# Patient Record
Sex: Female | Born: 1985 | Race: Black or African American | Hispanic: No | Marital: Married | Smoking: Current every day smoker
Health system: Southern US, Community
[De-identification: ages and names within clinical notes are randomized; demographics above are authoritative.]

## PROBLEM LIST (undated history)

## (undated) DIAGNOSIS — O24419 Gestational diabetes mellitus in pregnancy, unspecified control: Secondary | ICD-10-CM

## (undated) DIAGNOSIS — J45909 Unspecified asthma, uncomplicated: Secondary | ICD-10-CM

## (undated) HISTORY — PX: TONSILLECTOMY AND ADENOIDECTOMY: SUR1326

---

## 2012-05-16 ENCOUNTER — Emergency Department (HOSPITAL_BASED_OUTPATIENT_CLINIC_OR_DEPARTMENT_OTHER)
Admission: EM | Admit: 2012-05-16 | Discharge: 2012-05-16 | Disposition: A | Discharge status: Home / Self Care | Payer: Self-pay | Attending: Emergency Medicine | Admitting: Emergency Medicine

## 2012-05-16 ENCOUNTER — Encounter (HOSPITAL_BASED_OUTPATIENT_CLINIC_OR_DEPARTMENT_OTHER): Payer: Self-pay | Admitting: *Deleted

## 2012-05-16 DIAGNOSIS — K143 Hypertrophy of tongue papillae: Secondary | ICD-10-CM | POA: Insufficient documentation

## 2012-05-16 MED ORDER — MAGIC MOUTHWASH: 10.0000 mL | Freq: Four times a day (QID) | ORAL | Status: DC | PRN

## 2012-05-16 NOTE — ED Notes (Signed)
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 Pt now c/o blisters in her mouth, denies fever

## 2012-05-16 NOTE — ED Notes (Signed)
MD at bedside. 

## 2012-05-16 NOTE — ED Provider Notes (Signed)
History  This chart was scribed for Ethelda Chick, MD by Shari Heritage, ED Scribe. The patient was seen in room MH05/MH05. Patient's care was started at 2208.   CSN: [id number]  Arrival date & time 05/16/12  2101   First MD Initiated Contact with Patient 05/16/12 2208      Chief Complaint  Patient presents with  . Mouth Lesions     Patient is a 27 y.o. female presenting with mouth sores. The history is provided by the patient. No language interpreter was used.  Mouth Lesions  The current episode started today. The problem occurs rarely. The problem has been unchanged. The problem is mild. Associated symptoms include mouth sores. Pertinent negatives include no fever, no vomiting and no rash.     HPI Comments: Hannah Morris is a 27 y.o. female who presents to the Emergency Department complaining of painful blisters on her tongue onset 2 hours ago. Patient denies fever, shortness of breath, vomiting. 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 She denies any rashes to the hands or feet. Patient has no pertinent past medical history. She does not smoke.   History reviewed. No pertinent past medical history.  History reviewed. No pertinent past surgical history.  History reviewed. No pertinent family history.  History  Substance Use Topics  . Smoking status: Never Smoker   . Smokeless tobacco: Not on file  . Alcohol Use: No    OB History   Grav Para Term Preterm Abortions TAB SAB Ect Mult Living                  Review of Systems  Constitutional: Negative for fever.  HENT: Positive for mouth sores.   Respiratory: Negative for shortness of breath.   Gastrointestinal: Negative for vomiting.  Skin: Negative for rash.  All other systems reviewed and are negative.    Allergies  Review of patient's allergies indicates no known allergies.  Home Medications   Current Outpatient Rx  Name  Route  Sig   Dispense  Refill  . Alum & Mag Hydroxide-Simeth (MAGIC MOUTHWASH) SOLN   Oral   Take 10 mLs by mouth 4 (four) times daily as needed.   200 mL   0     Triage Vitals: BP 148/80  Pulse 60  Temp(Src) 98.5 F (36.9 C) (Oral)  Resp 20  Ht 5\' 2"  (1.575 m)  Wt 256 lb (116.121 kg)  BMI 46.81 kg/m2  SpO2 100%  Physical Exam  Constitutional: She is oriented to person, place, and time. She appears well-developed and well-nourished. No distress.  HENT:  Head: Normocephalic and atraumatic.  Mouth/Throat: Oropharynx is clear and moist and mucous membranes are normal. Mucous membranes are not dry. No oral lesions.  Pinpoint white taste bud that looks inflamed.   Eyes: Conjunctivae and EOM are normal. Pupils are equal, round, and reactive to light.  Neck: Normal range of motion. Neck supple.  Cardiovascular: Normal rate, regular rhythm and normal heart sounds.   No murmur heard. Pulmonary/Chest: Effort normal and breath sounds normal. No respiratory distress. She has no wheezes. She has no rales.  Abdominal: Soft. Bowel sounds are normal. She exhibits no distension. There is no tenderness.  Musculoskeletal: Normal range of motion.  Neurological: She is alert and oriented to person, place, and time.  Skin: Skin is warm and dry. No rash noted.  Psychiatric: She has a normal mood and affect. Her  behavior is normal.    ED Course  Procedures (including critical care time) DIAGNOSTIC STUDIES: Oxygen Saturation is 100% on room air, normal by my interpretation.    COORDINATION OF CARE: 10:21 PM- Patient informed of current plan for treatment and evaluation and agrees with plan at this time.     Labs Reviewed - No data to display No results found.   1. Hypertrophy of tongue papillae       MDM  Pt presenting with c/o painful sore on her tongue- she is concerned as she has been exposed to kids at daycare with hand/foot/mouth.  On exam it appears more c/w tongue papilla that is swollen.   No evidence of current coxsackie infection.  Pt appears well hydrated and nontoxic.  Gave rx for magic mouthwash and advised ibuprofen.  Discharged with strict return precautions.  Pt agreeable with plan.    I personally performed the services described in this documentation, which was scribed in my presence. The recorded information has been reviewed and is accurate.    Ethelda Chick, MD 05/16/12 [phone]

## 2012-05-18 ENCOUNTER — Telehealth (HOSPITAL_COMMUNITY): Payer: Self-pay | Admitting: Emergency Medicine

## 2012-05-18 NOTE — ED Notes (Signed)
Late entry 05/17/12 Chart reviewed by Dr Blinda Leatherwood   "Magic mouthwash formula 1:1:1 Benadryl sol.: Viscous lidocaine 2%:Maalox 10 ml swish and spit or swallow q 4 hr prn, # 120 ml"  Rx called to pharmacy [phone] 05/17/12 @1852 

## 2012-10-26 ENCOUNTER — Emergency Department (HOSPITAL_BASED_OUTPATIENT_CLINIC_OR_DEPARTMENT_OTHER)
Admission: EM | Admit: 2012-10-26 | Discharge: 2012-10-26 | Disposition: A | Discharge status: Home / Self Care | Payer: Self-pay | Attending: Emergency Medicine | Admitting: Emergency Medicine

## 2012-10-26 ENCOUNTER — Emergency Department (HOSPITAL_BASED_OUTPATIENT_CLINIC_OR_DEPARTMENT_OTHER): Payer: Self-pay

## 2012-10-26 ENCOUNTER — Encounter (HOSPITAL_BASED_OUTPATIENT_CLINIC_OR_DEPARTMENT_OTHER): Payer: Self-pay | Admitting: *Deleted

## 2012-10-26 DIAGNOSIS — R109 Unspecified abdominal pain: Secondary | ICD-10-CM | POA: Insufficient documentation

## 2012-10-26 DIAGNOSIS — N939 Abnormal uterine and vaginal bleeding, unspecified: Secondary | ICD-10-CM

## 2012-10-26 DIAGNOSIS — Z3202 Encounter for pregnancy test, result negative: Secondary | ICD-10-CM | POA: Insufficient documentation

## 2012-10-26 DIAGNOSIS — N898 Other specified noninflammatory disorders of vagina: Secondary | ICD-10-CM | POA: Insufficient documentation

## 2012-10-26 LAB — URINALYSIS, ROUTINE W REFLEX MICROSCOPIC
Glucose, UA: NEGATIVE mg/dL
Ketones, ur: NEGATIVE mg/dL
Nitrite: NEGATIVE
Protein, ur: NEGATIVE mg/dL
Urobilinogen, UA: 1 mg/dL (ref 0.0–1.0)

## 2012-10-26 LAB — CBC WITH DIFFERENTIAL/PLATELET
Basophils Absolute: 0 10*3/uL (ref 0.0–0.1)
HCT: 35.3 % — ABNORMAL LOW (ref 36.0–46.0)
Lymphocytes Relative: 26 % (ref 12–46)
Lymphs Abs: 2.2 10*3/uL (ref 0.7–4.0)
Monocytes Absolute: 0.7 10*3/uL (ref 0.1–1.0)
Neutro Abs: 5.7 10*3/uL (ref 1.7–7.7)
Platelets: 247 10*3/uL (ref 150–400)
RBC: 3.77 MIL/uL — ABNORMAL LOW (ref 3.87–5.11)
RDW: 12.1 % (ref 11.5–15.5)
WBC: 8.8 10*3/uL (ref 4.0–10.5)

## 2012-10-26 LAB — PREGNANCY, URINE: Preg Test, Ur: NEGATIVE

## 2012-10-26 LAB — URINE MICROSCOPIC-ADD ON

## 2012-10-26 LAB — WET PREP, GENITAL

## 2012-10-26 NOTE — ED Notes (Signed)
Heavy vaginal bleeding states she is having to wear 3 pads at the time and having to change every 1.5 to 2.0 hours. Cramping started yesterday pt has iud that was placed in January.

## 2012-10-26 NOTE — ED Notes (Signed)
Pt returned from ultrasound

## 2012-10-26 NOTE — ED Provider Notes (Signed)
CSN: [id number]     Arrival date & time 10/26/12  7829 History   First MD Initiated Contact with Patient 10/26/12 [phone]     Chief Complaint  Patient presents with  . Vaginal Bleeding   (Consider location/radiation/quality/duration/timing/severity/associated sxs/prior Treatment) HPI Comments: Patient is a 27 year old female otherwise healthy presents to the emergency department with complaints of vaginal bleeding since last night. She states that she passed a large clot this morning decided to be evaluated. She denies any fevers or chills but does admit to some lower abdominal cramping. She currently has an IUD in place and is sexually active with her husband. She admits to a possibility of pregnancy, but she doubts this. She has not done a home test. She denies any dizziness or lightheadedness.  Patient is a 27 y.o. female presenting with vaginal bleeding. The history is provided by the patient.  Vaginal Bleeding Quality:  Clots and dark red Severity:  Moderate Onset quality:  Sudden Duration:  12 hours Timing:  Constant Progression:  Worsening Chronicity:  New Menstrual history:  Irregular Context: not after urination   Relieved by:  Nothing Worsened by:  Nothing tried Ineffective treatments:  None tried   History reviewed. No pertinent past medical history. History reviewed. No pertinent past surgical history. History reviewed. No pertinent family history. History  Substance Use Topics  . Smoking status: Never Smoker   . Smokeless tobacco: Not on file  . Alcohol Use: No   OB History   Grav Para Term Preterm Abortions TAB SAB Ect Mult Living                 Review of Systems  Genitourinary: Positive for vaginal bleeding.  All other systems reviewed and are negative.    Allergies  Review of patient's allergies indicates no known allergies.  Home Medications   Current Outpatient Rx  Name  Route  Sig  Dispense  Refill  . Alum & Mag Hydroxide-Simeth (MAGIC MOUTHWASH)  SOLN   Oral   Take 10 mLs by mouth 4 (four) times daily as needed.   200 mL   0    BP 142/81  Pulse 63  Temp(Src) 98.6 F (37 C) (Oral)  Resp 18  Ht 5\' 2"  (1.575 m)  Wt 256 lb (116.121 kg)  BMI 46.81 kg/m2  SpO2 100%  LMP 10/25/2012 Physical Exam  Nursing note and vitals reviewed. Constitutional: She is oriented to person, place, and time. She appears well-developed and well-nourished. No distress.  HENT:  Head: Normocephalic and atraumatic.  Neck: Normal range of motion. Neck supple.  Cardiovascular: Normal rate and regular rhythm.  Exam reveals no gallop and no friction rub.   No murmur heard. Pulmonary/Chest: Effort normal and breath sounds normal. No respiratory distress. She has no wheezes.  Abdominal: Soft. Bowel sounds are normal. She exhibits no distension. There is no tenderness.  Genitourinary: Vagina normal. No vaginal discharge found.  The external genitalia appears normal. There is dark red blood in the vaginal vault. There is no cervical motion tenderness and no adnexal tenderness or masses.  Musculoskeletal: Normal range of motion.  Neurological: She is alert and oriented to person, place, and time.  Skin: Skin is warm and dry. She is not diaphoretic.    ED Course  Procedures (including critical care time) Labs Review Labs Reviewed  WET PREP, GENITAL  GC/CHLAMYDIA PROBE AMP  COMPREHENSIVE METABOLIC PANEL  URINALYSIS, ROUTINE W REFLEX MICROSCOPIC  PREGNANCY, URINE   Imaging Review No results found.  MDM  No diagnosis found. This patient presents with complaints of vaginal bleeding with an IUD in place. She's not having any appreciable abdominal or pelvic pain and workup reveals a negative pregnancy test, unremarkable pelvic ultrasound and pelvic exam, and a stable hemoglobin. She does not appear to be actively bleeding at this time and I believe is stable for discharge. He feels I ruled out ectopic pregnancy, hemorrhagic ovarian cyst, uterine wall  abnormality. She has an IUD in place and ultrasound reveals it to be in appropriate position. I have advised her to followup with her GYN in the next few days to discuss what more workup needs to be done at this point and possible removal of the IUD if the bleeding does not stop. She appears stable for discharge is advised to return should her bleeding worsen, she developed high fever, or severe abdominal pain    Geoffery Lyons, MD 10/26/12 [phone]

## 2012-10-27 LAB — GC/CHLAMYDIA PROBE AMP: GC Probe RNA: NEGATIVE

## 2012-10-28 LAB — URINE CULTURE

## 2012-10-29 ENCOUNTER — Telehealth (HOSPITAL_COMMUNITY): Payer: Self-pay | Admitting: Emergency Medicine

## 2012-10-29 NOTE — ED Notes (Signed)
Post ED Visit - Positive Culture Follow-up: Successful Patient Follow-Up  Culture assessed and recommendations reviewed by: []  Wes Dulaney, Pharm.D., BCPS []  Celedonio Miyamoto, Pharm.D., BCPS []  Georgina Pillion, Pharm.D., BCPS []  [place], [address].D., BCPS, AAHIVP []  Estella Husk, Pharm.D., BCPS, AAHIVP [x]  Lysle Pearl, Pharm.D., BCPS  Positive urine culture  [x]  Patient discharged without antimicrobial prescription and treatment is now indicated []  Organism is resistant to prescribed ED discharge antimicrobial []  Patient with positive blood cultures  Changes discussed with ED provider: Ebbie Ridge PA-C New antibiotic prescription: If symptomatic, Keflex 500 mg PO BID x 7 days    Kylie A Holland 10/29/2012, 11:01 AM

## 2012-10-29 NOTE — Progress Notes (Signed)
ED Antimicrobial Stewardship Positive Culture Follow Up   Hannah Morris is an 27 y.o. female who presented to [facility] on 10/26/2012 with a chief complaint of  Chief Complaint  Patient presents with  . Vaginal Bleeding    Recent Results (from the past 720 hour(s))  WET PREP, GENITAL     Status: Abnormal   Collection Time    10/26/12  7:28 AM      Result Value Range Status   Yeast Wet Prep HPF POC NONE SEEN  NONE SEEN Final   Trich, Wet Prep NONE SEEN  NONE SEEN Final   Clue Cells Wet Prep HPF POC FEW (*) NONE SEEN Final   WBC, Wet Prep HPF POC FEW (*) NONE SEEN Final  GC/CHLAMYDIA PROBE AMP     Status: None   Collection Time    10/26/12  7:28 AM      Result Value Range Status   CT Probe RNA NEGATIVE  NEGATIVE Final   GC Probe RNA NEGATIVE  NEGATIVE Final   Comment: (NOTE)                                                                                              Normal Reference Range: Negative          Assay performed using the Gen-Probe APTIMA COMBO2 (R) Assay.     Acceptable specimen types for this assay include APTIMA Swabs (Unisex,     endocervical, urethral, or vaginal), first void urine, and ThinPrep     liquid based cytology samples.     Performed at [facility]  URINE CULTURE     Status: None   Collection Time    10/26/12  7:30 AM      Result Value Range Status   Specimen Description URINE, CLEAN CATCH   Final   Special Requests NONE   Final   Culture  Setup Time     Final   Value: 10/26/2012 20:54     Performed at [facility] Count     Final   Value: >=100,000 COLONIES/ML     Performed at [facility]   Culture     Final   Value: CITROBACTER KOSERI     Performed at [facility]   Report Status 10/28/2012 FINAL   Final   Organism ID, Bacteria CITROBACTER KOSERI   Final    []  Treated with x, organism resistant to prescribed antimicrobial [x]  Patient discharged originally without antimicrobial agent and treatment  is now indicated  New antibiotic prescription: If patient is symptomatic, will start cephalexin 500mg  PO BID x 7 days. If she is asymptomatic, no antibiotic is necessary.   ED Provider: Charlestine Night, PA   Prescilla Monger, Drake Leach 10/29/2012, 10:43 AM Clinical Pharmacist Phone# [phone]

## 2014-07-29 ENCOUNTER — Emergency Department (HOSPITAL_BASED_OUTPATIENT_CLINIC_OR_DEPARTMENT_OTHER): Payer: Medicaid Other

## 2014-07-29 ENCOUNTER — Emergency Department (HOSPITAL_BASED_OUTPATIENT_CLINIC_OR_DEPARTMENT_OTHER)
Admission: EM | Admit: 2014-07-29 | Discharge: 2014-07-29 | Disposition: A | Discharge status: Home / Self Care | Payer: Medicaid Other | Attending: Emergency Medicine | Admitting: Emergency Medicine

## 2014-07-29 ENCOUNTER — Encounter (HOSPITAL_BASED_OUTPATIENT_CLINIC_OR_DEPARTMENT_OTHER): Payer: Self-pay | Admitting: Emergency Medicine

## 2014-07-29 DIAGNOSIS — Z3A01 Less than 8 weeks gestation of pregnancy: Secondary | ICD-10-CM | POA: Insufficient documentation

## 2014-07-29 DIAGNOSIS — Z79899 Other long term (current) drug therapy: Secondary | ICD-10-CM | POA: Diagnosis not present

## 2014-07-29 DIAGNOSIS — O2311 Infections of bladder in pregnancy, first trimester: Secondary | ICD-10-CM | POA: Insufficient documentation

## 2014-07-29 DIAGNOSIS — R52 Pain, unspecified: Secondary | ICD-10-CM

## 2014-07-29 DIAGNOSIS — Z3490 Encounter for supervision of normal pregnancy, unspecified, unspecified trimester: Secondary | ICD-10-CM

## 2014-07-29 DIAGNOSIS — O9989 Other specified diseases and conditions complicating pregnancy, childbirth and the puerperium: Secondary | ICD-10-CM | POA: Diagnosis present

## 2014-07-29 DIAGNOSIS — N3000 Acute cystitis without hematuria: Secondary | ICD-10-CM

## 2014-07-29 LAB — URINALYSIS, ROUTINE W REFLEX MICROSCOPIC
Bilirubin Urine: NEGATIVE
GLUCOSE, UA: NEGATIVE mg/dL
HGB URINE DIPSTICK: NEGATIVE
KETONES UR: NEGATIVE mg/dL
Nitrite: NEGATIVE
PH: 6 (ref 5.0–8.0)
PROTEIN: NEGATIVE mg/dL
Specific Gravity, Urine: 1.023 (ref 1.005–1.030)
UROBILINOGEN UA: 4 mg/dL — AB (ref 0.0–1.0)

## 2014-07-29 LAB — URINE MICROSCOPIC-ADD ON

## 2014-07-29 LAB — HCG, QUANTITATIVE, PREGNANCY: HCG, BETA CHAIN, QUANT, S: 489 m[IU]/mL — AB (ref <5)

## 2014-07-29 LAB — PREGNANCY, URINE: Preg Test, Ur: POSITIVE — AB

## 2014-07-29 MED ORDER — CEPHALEXIN 500 MG PO CAPS: 500.0000 mg | ORAL_CAPSULE | Freq: Three times a day (TID) | ORAL | Status: DC

## 2014-07-29 NOTE — Discharge Instructions (Signed)
Urinary Tract Infection Urinary tract infections (UTIs) can develop anywhere along your urinary tract. Your urinary tract is your body's drainage system for removing wastes and extra water. Your urinary tract includes two kidneys, two ureters, a bladder, and a urethra. Your kidneys are a pair of bean-shaped organs. Each kidney is about the size of your fist. They are located below your ribs, one on each side of your spine. CAUSES Infections are caused by microbes, which are microscopic organisms, including fungi, viruses, and bacteria. These organisms are so small that they can only be seen through a microscope. Bacteria are the microbes that most commonly cause UTIs. SYMPTOMS  Symptoms of UTIs may vary by age and gender of the patient and by the location of the infection. Symptoms in young women typically include a frequent and intense urge to urinate and a painful, burning feeling in the bladder or urethra during urination. Older women and men are more likely to be tired, shaky, and weak and have muscle aches and abdominal pain. A fever may mean the infection is in your kidneys. Other symptoms of a kidney infection include pain in your back or sides below the ribs, nausea, and vomiting. DIAGNOSIS To diagnose a UTI, your caregiver will ask you about your symptoms. Your caregiver also will ask to provide a urine sample. The urine sample will be tested for bacteria and white blood cells. White blood cells are made by your body to help fight infection. TREATMENT  Typically, UTIs can be treated with medication. Because most UTIs are caused by a bacterial infection, they usually can be treated with the use of antibiotics. The choice of antibiotic and length of treatment depend on your symptoms and the type of bacteria causing your infection. HOME CARE INSTRUCTIONS  If you were prescribed antibiotics, take them exactly as your caregiver instructs you. Finish the medication even if you feel better after you  have only taken some of the medication.  Drink enough water and fluids to keep your urine clear or pale yellow.  Avoid caffeine, tea, and carbonated beverages. They tend to irritate your bladder.  Empty your bladder often. Avoid holding urine for long periods of time.  Empty your bladder before and after sexual intercourse.  After a bowel movement, women should cleanse from front to back. Use each tissue only once. SEEK MEDICAL CARE IF:   You have back pain.  You develop a fever.  Your symptoms do not begin to resolve within 3 days. SEEK IMMEDIATE MEDICAL CARE IF:   You have severe back pain or lower abdominal pain.  You develop chills.  You have nausea or vomiting.  You have continued burning or discomfort with urination. MAKE SURE YOU:   Understand these instructions.  Will watch your condition.  Will get help right away if you are not doing well or get worse. Document Released: 11/19/2004 Document Revised: 08/11/2011 Document Reviewed: 03/20/2011 ExitCare Patient Information 2015 [employer]. This information is not intended to replace advice given to you by your health care provider. Make sure you discuss any questions you have with your health care provider. Pregnancy and Urinary Tract Infection A urinary tract infection (UTI) is a bacterial infection of the urinary tract. Infection of the urinary tract can include the ureters, kidneys (pyelonephritis), bladder (cystitis), and urethra (urethritis). All pregnant women should be screened for bacteria in the urinary tract. Identifying and treating a UTI will decrease the risk of preterm labor and developing more serious infections in both the mother   and baby. CAUSES Bacteria germs cause almost all UTIs.  RISK FACTORS Many factors can increase your chances of getting a UTI during pregnancy. These include:  Having a short urethra.  Poor toilet and hygiene habits.  Sexual intercourse.  Blockage of urine along the  urinary tract.  Problems with the pelvic muscles or nerves.  Diabetes.  Obesity.  Bladder problems after having several children.  Previous history of UTI. SIGNS AND SYMPTOMS   Pain, burning, or a stinging feeling when urinating.  Suddenly feeling the need to urinate right away (urgency).  Loss of bladder control (urinary incontinence).  Frequent urination, more than is common with pregnancy.  Lower abdominal or back discomfort.  Cloudy urine.  Blood in the urine (hematuria).  Fever. When the kidneys are infected, the symptoms may be:  Back pain.  Flank pain on the right side more so than the left.  Fever.  Chills.  Nausea.  Vomiting. DIAGNOSIS  A urinary tract infection is usually diagnosed through urine tests. Additional tests and procedures are sometimes done. These may include:  Ultrasound exam of the kidneys, ureters, bladder, and urethra.  Looking in the bladder with a lighted tube (cystoscopy). TREATMENT Typically, UTIs can be treated with antibiotic medicines.  HOME CARE INSTRUCTIONS   Only take over-the-counter or prescription medicines as directed by your health care provider. If you were prescribed antibiotics, take them as directed. Finish them even if you start to feel better.  Drink enough fluids to keep your urine clear or pale yellow.  Do not have sexual intercourse until the infection is gone and your health care provider says it is okay.  Make sure you are tested for UTIs throughout your pregnancy. These infections often come back. Preventing a UTI in the Future  Practice good toilet habits. Always wipe from front to back. Use the tissue only once.  Do not hold your urine. Empty your bladder as soon as possible when the urge comes.  Do not douche or use deodorant sprays.  Wash with soap and warm water around the genital area and the anus.  Empty your bladder before and after sexual intercourse.  Wear underwear with a cotton  crotch.  Avoid caffeine and carbonated drinks. They can irritate the bladder.  Drink cranberry juice or take cranberry pills. This may decrease the risk of getting a UTI.  Do not drink alcohol.  Keep all your appointments and tests as scheduled. SEEK MEDICAL CARE IF:   Your symptoms get worse.  You are still having fevers 2 or more days after treatment begins.  You have a rash.  You feel that you are having problems with medicines prescribed.  You have abnormal vaginal discharge. SEEK IMMEDIATE MEDICAL CARE IF:   You have back or flank pain.  You have chills.  You have blood in your urine.  You have nausea and vomiting.  You have contractions of your uterus.  You have a gush of fluid from the vagina. MAKE SURE YOU:  Understand these instructions.   Will watch your condition.   Will get help right away if you are not doing well or get worse.  Document Released: 06/06/2010 Document Revised: 11/30/2012 Document Reviewed: 09/08/2012 ExitCare Patient Information 2015 [employer]. This information is not intended to replace advice given to you by your health care provider. Make sure you discuss any questions you have with your health care provider.  

## 2014-07-29 NOTE — ED Provider Notes (Signed)
CSN: [id number]     Arrival date & time 07/29/14  1238 History   First MD Initiated Contact with Patient 07/29/14 1253     Chief Complaint  Patient presents with  . Abdominal Pain     (Consider location/radiation/quality/duration/timing/severity/associated sxs/prior Treatment) Patient is a 29 y.o. female presenting with abdominal pain. The history is provided by the patient.  Abdominal Pain Pain location:  Suprapubic Pain quality: pressure   Pain radiates to:  Does not radiate Pain severity:  Mild Onset quality:  Gradual Duration:  3 days Timing:  Constant Progression:  Unchanged Chronicity:  New Relieved by:  Nothing Worsened by:  Nothing tried Associated symptoms: no chest pain, no cough, no dysuria, no fever, no shortness of breath, no vaginal bleeding, no vaginal discharge and no vomiting     History reviewed. No pertinent past medical history. History reviewed. No pertinent past surgical history. History reviewed. No pertinent family history. History  Substance Use Topics  . Smoking status: Never Smoker   . Smokeless tobacco: Not on file  . Alcohol Use: No   OB History    No data available     Review of Systems  Constitutional: Negative for fever.  Respiratory: Negative for cough and shortness of breath.   Cardiovascular: Negative for chest pain and leg swelling.  Gastrointestinal: Negative for vomiting and abdominal pain.  Genitourinary: Negative for dysuria, vaginal bleeding, vaginal discharge and difficulty urinating.  All other systems reviewed and are negative.     Allergies  Review of patient's allergies indicates no known allergies.  Home Medications   Prior to Admission medications   Medication Sig Start Date End Date Taking? Authorizing Provider  Alum & Mag Hydroxide-Simeth (MAGIC MOUTHWASH) SOLN Take 10 mLs by mouth 4 (four) times daily as needed. 05/16/12   Jerelyn Scott, MD   BP 139/78 mmHg  Pulse 81  Temp(Src) 98.1 F (36.7 C) (Oral)   Resp 16  Ht  (1.575 m)  Wt 280 lb (127.007 kg)  BMI 51.20 kg/m2  SpO2 100%  LMP 06/25/2014 (Exact Date) Physical Exam  Constitutional: She is oriented to person, place, and time. She appears well-developed and well-nourished. No distress.  HENT:  Head: Normocephalic and atraumatic.  Mouth/Throat: Oropharynx is clear and moist.  Eyes: EOM are normal. Pupils are equal, round, and reactive to light.  Neck: Normal range of motion. Neck supple.  Cardiovascular: Normal rate and regular rhythm.  Exam reveals no friction rub.   No murmur heard. Pulmonary/Chest: Effort normal and breath sounds normal. No respiratory distress. She has no wheezes. She has no rales.  Abdominal: Soft. She exhibits no distension. There is tenderness (mild, suprapubic). There is no rebound.  Musculoskeletal: Normal range of motion. She exhibits no edema.  Neurological: She is alert and oriented to person, place, and time.  Skin: She is not diaphoretic.  Nursing note and vitals reviewed.   ED Course  Procedures (including critical care time) Labs Review Labs Reviewed  PREGNANCY, URINE - Abnormal; Notable for the following:    Preg Test, Ur POSITIVE (*)    All other components within normal limits  URINALYSIS, ROUTINE W REFLEX MICROSCOPIC (NOT AT [facility])  HCG, QUANTITATIVE, PREGNANCY    Imaging Review US Ob Comp Less 14 Wks  07/29/2014   CLINICAL DATA:  Pelvic pain for 2 weeks. Estimated gestational age by the last menstrual period equals 4 weeks 6 days.  EXAM: OBSTETRIC <14 WK [place] AND TRANSVAGINAL OB US  TECHNIQUE: Both transabdominal and transvaginal  ultrasound examinations were performed for complete evaluation of the gestation as well as the maternal uterus, adnexal regions, and pelvic cul-de-sac. Transvaginal technique was performed to assess early pregnancy.  COMPARISON:  None.  FINDINGS: Intrauterine gestational sac: Not present  Maternal uterus/adnexae: Right ovary measures 2.8 x 2.9 by 3.2 cm. Left ovary  measures 3.4 x 1.5 by 2.4 cm. No free fluid in the pelvis.  The endometrium measures 11 mm without clear decidual reaction.  IMPRESSION: No intrauterine gestation. Differential includes early intrauterine pregnancy, spontaneous abortion, or occult ectopic pregnancy.  Recommend follow-up beta HCG and follow-up ultrasound pelvic imaging.   Electronically Signed   By: Genevive Bi M.D.   On: 07/29/2014 14:20   US Ob Transvaginal  07/29/2014   CLINICAL DATA:  Pelvic pain for 2 weeks. Estimated gestational age by the last menstrual period equals 4 weeks 6 days.  EXAM: OBSTETRIC <14 WK [place] AND TRANSVAGINAL OB US  TECHNIQUE: Both transabdominal and transvaginal ultrasound examinations were performed for complete evaluation of the gestation as well as the maternal uterus, adnexal regions, and pelvic cul-de-sac. Transvaginal technique was performed to assess early pregnancy.  COMPARISON:  None.  FINDINGS: Intrauterine gestational sac: Not present  Maternal uterus/adnexae: Right ovary measures 2.8 x 2.9 by 3.2 cm. Left ovary measures 3.4 x 1.5 by 2.4 cm. No free fluid in the pelvis.  The endometrium measures 11 mm without clear decidual reaction.  IMPRESSION: No intrauterine gestation. Differential includes early intrauterine pregnancy, spontaneous abortion, or occult ectopic pregnancy.  Recommend follow-up beta HCG and follow-up ultrasound pelvic imaging.   Electronically Signed   By: Genevive Bi M.D.   On: 07/29/2014 14:20     EKG Interpretation None      MDM   Final diagnoses:  Acute cystitis without hematuria  Pregnancy    29 year old female here with lower abdominal pain. She has a pressure-like sensation in her lower abdomen. His been there for the past 2-3 days. Last period was roughly 4.5-5 weeks ago. No fever, nausea, vomiting. No diarrhea. No pain with urination. On exam she has lower abdominal pain. No right lower quadrant or left lower quadrant pain. No central abdominal or epigastric  pain. Previous test is positive. Will obtain a beta hCG and ultrasound.  [place] normal. Beta quant under discriminatory zone. Instructed to f/u in 2-3 days for repeat measurement. [facility]  Given antibiotics for her UTI.  Stable for discharge.  Elwin Mocha, MD 07/29/14 [id number]

## 2014-07-29 NOTE — ED Notes (Signed)
Patient reports lower abdominal pain.  Reports nausea.  Denies vomiting/diarrhea/dysuria/hematuria.

## 2014-09-28 ENCOUNTER — Encounter (HOSPITAL_BASED_OUTPATIENT_CLINIC_OR_DEPARTMENT_OTHER): Payer: Self-pay | Admitting: *Deleted

## 2014-09-28 ENCOUNTER — Emergency Department (HOSPITAL_BASED_OUTPATIENT_CLINIC_OR_DEPARTMENT_OTHER): Payer: Medicaid Other

## 2014-09-28 ENCOUNTER — Emergency Department (HOSPITAL_BASED_OUTPATIENT_CLINIC_OR_DEPARTMENT_OTHER)
Admission: EM | Admit: 2014-09-28 | Discharge: 2014-09-28 | Disposition: A | Discharge status: Home / Self Care | Payer: Medicaid Other | Attending: Emergency Medicine | Admitting: Emergency Medicine

## 2014-09-28 DIAGNOSIS — Z8632 Personal history of gestational diabetes: Secondary | ICD-10-CM | POA: Insufficient documentation

## 2014-09-28 DIAGNOSIS — J45909 Unspecified asthma, uncomplicated: Secondary | ICD-10-CM | POA: Diagnosis not present

## 2014-09-28 DIAGNOSIS — O2391 Unspecified genitourinary tract infection in pregnancy, first trimester: Secondary | ICD-10-CM | POA: Diagnosis not present

## 2014-09-28 DIAGNOSIS — N920 Excessive and frequent menstruation with regular cycle: Secondary | ICD-10-CM

## 2014-09-28 DIAGNOSIS — O210 Mild hyperemesis gravidarum: Secondary | ICD-10-CM | POA: Diagnosis present

## 2014-09-28 DIAGNOSIS — Z3490 Encounter for supervision of normal pregnancy, unspecified, unspecified trimester: Secondary | ICD-10-CM

## 2014-09-28 DIAGNOSIS — O99511 Diseases of the respiratory system complicating pregnancy, first trimester: Secondary | ICD-10-CM | POA: Insufficient documentation

## 2014-09-28 DIAGNOSIS — Z79899 Other long term (current) drug therapy: Secondary | ICD-10-CM | POA: Insufficient documentation

## 2014-09-28 DIAGNOSIS — N760 Acute vaginitis: Secondary | ICD-10-CM

## 2014-09-28 DIAGNOSIS — O200 Threatened abortion: Secondary | ICD-10-CM | POA: Diagnosis not present

## 2014-09-28 DIAGNOSIS — Z3A13 13 weeks gestation of pregnancy: Secondary | ICD-10-CM | POA: Insufficient documentation

## 2014-09-28 DIAGNOSIS — R112 Nausea with vomiting, unspecified: Secondary | ICD-10-CM

## 2014-09-28 DIAGNOSIS — B9689 Other specified bacterial agents as the cause of diseases classified elsewhere: Secondary | ICD-10-CM

## 2014-09-28 HISTORY — DX: Unspecified asthma, uncomplicated: J45.909

## 2014-09-28 HISTORY — DX: Gestational diabetes mellitus in pregnancy, unspecified control: O24.419

## 2014-09-28 LAB — BASIC METABOLIC PANEL
ANION GAP: 11 (ref 5–15)
BUN: 8 mg/dL (ref 6–20)
CHLORIDE: 104 mmol/L (ref 101–111)
CO2: 20 mmol/L — ABNORMAL LOW (ref 22–32)
Calcium: 9.5 mg/dL (ref 8.9–10.3)
Creatinine, Ser: 0.76 mg/dL (ref 0.44–1.00)
GFR calc Af Amer: >60 mL/min (ref >60)
GFR calc non Af Amer: >60 mL/min (ref >60)
GLUCOSE: 138 mg/dL — AB (ref 65–99)
Potassium: 3.6 mmol/L (ref 3.5–5.1)
Sodium: 135 mmol/L (ref 135–145)

## 2014-09-28 LAB — CBC WITH DIFFERENTIAL/PLATELET
Basophils Absolute: 0 10*3/uL (ref 0.0–0.1)
Basophils Relative: 0 % (ref 0–1)
Eosinophils Absolute: 0 10*3/uL (ref 0.0–0.7)
Eosinophils Relative: 0 % (ref 0–5)
HCT: 34 % — ABNORMAL LOW (ref 36.0–46.0)
Hemoglobin: 11.7 g/dL — ABNORMAL LOW (ref 12.0–15.0)
LYMPHS PCT: 18 % (ref 12–46)
Lymphs Abs: 2 10*3/uL (ref 0.7–4.0)
MCH: 31.4 pg (ref 26.0–34.0)
MCHC: 34.4 g/dL (ref 30.0–36.0)
MCV: 91.2 fL (ref 78.0–100.0)
MONOS PCT: 6 % (ref 3–12)
Monocytes Absolute: 0.6 10*3/uL (ref 0.1–1.0)
NEUTROS ABS: 8.4 10*3/uL — AB (ref 1.7–7.7)
Neutrophils Relative %: 76 % (ref 43–77)
Platelets: 319 10*3/uL (ref 150–400)
RBC: 3.73 MIL/uL — ABNORMAL LOW (ref 3.87–5.11)
RDW: 12 % (ref 11.5–15.5)
WBC: 11 10*3/uL — ABNORMAL HIGH (ref 4.0–10.5)

## 2014-09-28 LAB — WET PREP, GENITAL
TRICH WET PREP: NONE SEEN
Yeast Wet Prep HPF POC: NONE SEEN

## 2014-09-28 LAB — ABO/RH: ABO/RH(D): A POS

## 2014-09-28 LAB — HCG, SERUM, QUALITATIVE: Preg, Serum: POSITIVE — AB

## 2014-09-28 MED ORDER — SODIUM CHLORIDE 0.9 % IV BOLUS (SEPSIS)
1000.0000 mL | Freq: Once | INTRAVENOUS | Status: AC
  Administered 2014-09-28: 1000 mL via INTRAVENOUS

## 2014-09-28 MED ORDER — ONDANSETRON HCL 4 MG/2ML IJ SOLN
4.0000 mg | Freq: Once | INTRAMUSCULAR | Status: AC
  Administered 2014-09-28: 4 mg via INTRAVENOUS
  Filled 2014-09-28: qty 2

## 2014-09-28 MED ORDER — ONDANSETRON HCL 4 MG PO TABS: 4.0000 mg | ORAL_TABLET | Freq: Four times a day (QID) | ORAL | Status: DC | PRN

## 2014-09-28 MED ORDER — METRONIDAZOLE 500 MG PO TABS: 500.0000 mg | ORAL_TABLET | Freq: Two times a day (BID) | ORAL | Status: DC

## 2014-09-28 NOTE — ED Provider Notes (Signed)
CSN: [id number]     Arrival date & time 09/28/14  1614 History   First MD Initiated Contact with Patient 09/28/14 1624     No chief complaint on file.  CC: pregnant/can't keep food down   (Consider location/radiation/quality/duration/timing/severity/associated sxs/prior Treatment)  HPI 70F G4P3 at [redacted] weeks GA who presents with N/V since Monday.  She was recently diagnosed with UTI and gestational diabetes and was started on glipizide and Bactrim. She started taking these medications on Monday, but shortly afterwards developed  Intractable nausea and vomiting. She has been unable to tolerate by mouth since Monday. She has noted a low abdominal cramping , as well as vaginal spotting. She denies any diarrhea, melena, hematochezia, hematemesis , fevers, chills, dysuria, urinary frequency. She has some mild vaginal discharge during this time. Reports setting up routine prenatal care with an OB physician and had a OB ultrasound early in pregnancy, which was unable to visualize her fetus.   Past Medical History  Diagnosis Date  . Asthma   . Gestational diabetes    History reviewed. No pertinent past surgical history. History reviewed. No pertinent family history. History  Substance Use Topics  . Smoking status: Never Smoker   . Smokeless tobacco: Not on file  . Alcohol Use: No   OB History    No data available     Review of Systems 10/14 systems reviewed and are negative other than those stated in the HPI   Allergies  Review of patient's allergies indicates no known allergies.  Home Medications   Prior to Admission medications   Medication Sig Start Date End Date Taking? Authorizing Provider  glyBURIDE (DIABETA) 2.5 MG tablet Take 2.5 mg by mouth daily with breakfast.   Yes Historical Provider, MD  sulfamethoxazole-trimethoprim (BACTRIM DS,SEPTRA DS) 800-160 MG per tablet Take 1 tablet by mouth 2 (two) times daily.   Yes Historical Provider, MD  metroNIDAZOLE (FLAGYL) 500 MG  tablet Take 1 tablet (500 mg total) by mouth 2 (two) times daily. 09/28/14   Lavera Guise, MD  ondansetron (ZOFRAN) 4 MG tablet Take 1 tablet (4 mg total) by mouth every 6 (six) hours as needed for nausea or vomiting. 09/28/14   Lavera Guise, MD   BP 129/65 mmHg  Pulse 72  Temp(Src) 98.5 F (36.9 C) (Oral)  Resp 18  Ht  (1.575 m)  Wt 280 lb (127.007 kg)  BMI 51.20 kg/m2  SpO2 100%  LMP 06/26/2014 Physical Exam  Nursing note and vitals reviewed. Physical Exam  Constitutional: Well developed, well nourished, non-toxic, and in no acute distress Head: Normocephalic and atraumatic.  Mouth/Throat: Oropharynx is clear and moist.  Neck: Normal range of motion. Neck supple.  Cardiovascular: Normal rate and regular rhythm.   Pulmonary/Chest: Effort normal and breath sounds normal.  Abdominal: Soft. There is minimal tenderness in her low abdomen There is no rebound and no guarding.  Pelvic exam: Normal internal and external genitalia. No blood. White copious vaginal discharge. No cervical motion tenderness. No adnexal tenderness or masses. Musculoskeletal: Normal range of motion.  Neurological: Alert, fluent speech, moves all extremities symmetrically Skin: Skin is warm and dry.  Psychiatric: Cooperative   ED Course  Procedures (including critical care time) Labs Review Labs Reviewed  WET PREP, GENITAL - Abnormal; Notable for the following:    Clue Cells Wet Prep HPF POC MODERATE (*)    WBC, Wet Prep HPF POC MODERATE (*)    All other components within normal limits  HCG, SERUM, QUALITATIVE -  Abnormal; Notable for the following:    Preg, Serum POSITIVE (*)    All other components within normal limits  BASIC METABOLIC PANEL - Abnormal; Notable for the following:    CO2 20 (*)    Glucose, Bld 138 (*)    All other components within normal limits  CBC WITH DIFFERENTIAL/PLATELET - Abnormal; Notable for the following:    WBC 11.0 (*)    RBC 3.73 (*)    Hemoglobin 11.7 (*)    HCT 34.0  (*)    Neutro Abs 8.4 (*)    All other components within normal limits  RPR  HIV ANTIBODY (ROUTINE TESTING)  ABO/RH  GC/CHLAMYDIA PROBE AMP ([place]) NOT AT [facility]    Imaging Review US Ob Comp Less 14 Wks  09/28/2014   CLINICAL DATA:  Patient with severe nausea and vomiting. Fatigue and sweats.  EXAM: OBSTETRIC <14 WK [place] AND TRANSVAGINAL OB US  TECHNIQUE: Both transabdominal and transvaginal ultrasound examinations were performed for complete evaluation of the gestation as well as the maternal uterus, adnexal regions, and pelvic cul-de-sac. Transvaginal technique was performed to assess early pregnancy.  COMPARISON:  Pelvic ultrasound 07/29/2014  FINDINGS: Intrauterine gestational sac: Visualized/normal in shape.  Yolk sac:  Not visualized  Embryo:  Present  Cardiac Activity: Present  Heart Rate: 143  bpm  CRL:  65.7  mm   12 w   6 d                  [place] EDC: 04/06/2015  Maternal uterus/adnexae: The right and left ovaries are not well visualized. No subchorionic hemorrhage. No free fluid in the pelvis.  IMPRESSION: Single live intrauterine gestation 12 weeks 5 days by LMP.   Electronically Signed   By: Annia Belt M.D.   On: 09/28/2014 17:54   US Ob Transvaginal  09/28/2014   CLINICAL DATA:  Patient with severe nausea and vomiting. Fatigue and sweats.  EXAM: OBSTETRIC <14 WK [place] AND TRANSVAGINAL OB US  TECHNIQUE: Both transabdominal and transvaginal ultrasound examinations were performed for complete evaluation of the gestation as well as the maternal uterus, adnexal regions, and pelvic cul-de-sac. Transvaginal technique was performed to assess early pregnancy.  COMPARISON:  Pelvic ultrasound 07/29/2014  FINDINGS: Intrauterine gestational sac: Visualized/normal in shape.  Yolk sac:  Not visualized  Embryo:  Present  Cardiac Activity: Present  Heart Rate: 143  bpm  CRL:  65.7  mm   12 w   6 d                  [place] EDC: 04/06/2015  Maternal uterus/adnexae: The right and left ovaries are not well visualized. No  subchorionic hemorrhage. No free fluid in the pelvis.  IMPRESSION: Single live intrauterine gestation 12 weeks 5 days by LMP.   Electronically Signed   By: Annia Belt M.D.   On: 09/28/2014 17:54     EKG Interpretation None      MDM   Final diagnoses:  Threatened abortion  Intrauterine pregnancy  Non-intractable vomiting with nausea, vomiting of unspecified type  Bacterial vaginosis     in short, this is a G4 P3 at [redacted] weeks gestational age who presents with intractable nausea, vomiting, abdominal pain, and vaginal spotting. She is nontoxic and in no acute distress on presentation. Her vital signs are within normal limits.  She is a soft and non-peritoneal abdomen. Pelvic exam reveals copious white vaginal discharge, but normal internal and external genitalia without adnexal tenderness or masses.  On chart review, she did undergo ultrasound pelvis for evaluation of her pregnancy, but no IUP was visualized as of June. There is concern for possible ectopic pregnancy versus threatened abortion versus other abnormal pregnancy. Pelvic ultrasound is performed showing evidence of an IUP at 12 weeks and 5 days with 143 fetal heart rate. She is given IV fluids and Zofran for symptomatic control, and reports resolution of her symptoms and is able to tolerate oral intake in the emergency department. Remainder of basic blood work is unremarkable including CBC, basic metabolic profile. She is Rh+ and does not require RhoGAM. No evidence of bleeding today on exam, but given her recent history of vaginal spotting I gave her  Teaching and warning signs for threatened miscarriage. She is given Zofran for home and on wet prep is also noted to have BV and given a prescription for Flagyl. Strict return and follow-up instructions were reviewed. She expresses understanding of all discharge instructions and felt comfortable with the plan of care.   Lavera Guise, MD 09/28/14 2224

## 2014-09-28 NOTE — ED Notes (Signed)
States she is 3 months pregnant and n/v started 3 days ago. States she feels cramping but has had no spotting. G4 P3.

## 2014-09-28 NOTE — Discharge Instructions (Signed)
Take your medications as prescribed. Return without fail for worsening symptoms, including intractable nausea and vomiting despite medications, fevers, worsening pain, or worsening vaginal bleeding using greater than 1 pad per hour, or any other symptoms concerning to you.  Bacterial Vaginosis Bacterial vaginosis is a vaginal infection that occurs when the normal balance of bacteria in the vagina is disrupted. It results from an overgrowth of certain bacteria. This is the most common vaginal infection in women of childbearing age. Treatment is important to prevent complications, especially in pregnant women, as it can cause a premature delivery. CAUSES  Bacterial vaginosis is caused by an increase in harmful bacteria that are normally present in smaller amounts in the vagina. Several different kinds of bacteria can cause bacterial vaginosis. However, the reason that the condition develops is not fully understood. RISK FACTORS Certain activities or behaviors can put you at an increased risk of developing bacterial vaginosis, including:  Having a new sex partner or multiple sex partners.  Douching.  Using an intrauterine device (IUD) for contraception. Women do not get bacterial vaginosis from toilet seats, bedding, swimming pools, or contact with objects around them. SIGNS AND SYMPTOMS  Some women with bacterial vaginosis have no signs or symptoms. Common symptoms include:  Grey vaginal discharge.  A fishlike odor with discharge, especially after sexual intercourse.  Itching or burning of the vagina and vulva.  Burning or pain with urination. DIAGNOSIS  Your health care provider will take a medical history and examine the vagina for signs of bacterial vaginosis. A sample of vaginal fluid may be taken. Your health care provider will look at this sample under a microscope to check for bacteria and abnormal cells. A vaginal pH test may also be done.  TREATMENT  Bacterial vaginosis may be  treated with antibiotic medicines. These may be given in the form of a pill or a vaginal cream. A second round of antibiotics may be prescribed if the condition comes back after treatment.  HOME CARE INSTRUCTIONS   Only take over-the-counter or prescription medicines as directed by your health care provider.  If antibiotic medicine was prescribed, take it as directed. Make sure you finish it even if you start to feel better.  Do not have sex until treatment is completed.  Tell all sexual partners that you have a vaginal infection. They should see their health care provider and be treated if they have problems, such as a mild rash or itching.  Practice safe sex by using condoms and only having one sex partner. SEEK MEDICAL CARE IF:   Your symptoms are not improving after 3 days of treatment.  You have increased discharge or pain.  You have a fever. MAKE SURE YOU:   Understand these instructions.  Will watch your condition.  Will get help right away if you are not doing well or get worse. FOR MORE INFORMATION  Centers for Disease Control and Prevention, Division of STD Prevention: [employer] American Sexual Health Association (ASHA): www.ashastd.org  Document Released: 02/09/2005 Document Revised: 11/30/2012 Document Reviewed: 09/21/2012 [facility] Patient Information 2015 [place], [place]. This information is not intended to replace advice given to you by your health care provider. Make sure you discuss any questions you have with your health care provider.

## 2014-09-28 NOTE — ED Notes (Signed)
Pt given graham crackers and soda.

## 2014-09-29 LAB — HIV ANTIBODY (ROUTINE TESTING W REFLEX): HIV Screen 4th Generation wRfx: NONREACTIVE

## 2014-09-29 LAB — RPR: RPR Ser Ql: NONREACTIVE

## 2014-10-01 LAB — GC/CHLAMYDIA PROBE AMP (~~LOC~~) NOT AT ARMC
Chlamydia: NEGATIVE
Neisseria Gonorrhea: NEGATIVE

## 2015-01-01 ENCOUNTER — Other Ambulatory Visit (HOSPITAL_COMMUNITY): Payer: Self-pay | Admitting: Specialist

## 2015-01-01 ENCOUNTER — Encounter (HOSPITAL_COMMUNITY): Payer: Self-pay

## 2015-01-01 ENCOUNTER — Ambulatory Visit (HOSPITAL_COMMUNITY)
Admission: RE | Admit: 2015-01-01 | Discharge: 2015-01-01 | Disposition: A | Discharge status: Home / Self Care | Payer: Medicaid Other | Source: Ambulatory Visit | Attending: Specialist | Admitting: Specialist

## 2015-01-01 DIAGNOSIS — Z3A26 26 weeks gestation of pregnancy: Secondary | ICD-10-CM

## 2015-01-01 DIAGNOSIS — O99519 Diseases of the respiratory system complicating pregnancy, unspecified trimester: Secondary | ICD-10-CM

## 2015-01-01 DIAGNOSIS — O24912 Unspecified diabetes mellitus in pregnancy, second trimester: Secondary | ICD-10-CM

## 2015-01-01 DIAGNOSIS — O99212 Obesity complicating pregnancy, second trimester: Secondary | ICD-10-CM

## 2015-01-01 DIAGNOSIS — O99332 Smoking (tobacco) complicating pregnancy, second trimester: Secondary | ICD-10-CM

## 2015-01-01 DIAGNOSIS — Z36 Encounter for antenatal screening of mother: Secondary | ICD-10-CM | POA: Insufficient documentation

## 2015-01-01 DIAGNOSIS — F17210 Nicotine dependence, cigarettes, uncomplicated: Secondary | ICD-10-CM | POA: Insufficient documentation

## 2015-01-01 DIAGNOSIS — Z3689 Encounter for other specified antenatal screening: Secondary | ICD-10-CM

## 2015-01-01 DIAGNOSIS — O24415 Gestational diabetes mellitus in pregnancy, controlled by oral hypoglycemic drugs: Secondary | ICD-10-CM | POA: Diagnosis not present

## 2015-01-01 DIAGNOSIS — J45909 Unspecified asthma, uncomplicated: Secondary | ICD-10-CM

## 2015-01-29 ENCOUNTER — Ambulatory Visit (HOSPITAL_COMMUNITY): Payer: Medicaid Other | Attending: Specialist

## 2015-11-06 ENCOUNTER — Encounter (HOSPITAL_COMMUNITY): Payer: Self-pay

## 2016-01-23 ENCOUNTER — Encounter (HOSPITAL_BASED_OUTPATIENT_CLINIC_OR_DEPARTMENT_OTHER): Payer: Self-pay | Admitting: *Deleted

## 2016-01-23 ENCOUNTER — Emergency Department (HOSPITAL_BASED_OUTPATIENT_CLINIC_OR_DEPARTMENT_OTHER)
Admission: EM | Admit: 2016-01-23 | Discharge: 2016-01-23 | Disposition: A | Discharge status: Home / Self Care | Payer: Self-pay | Attending: Emergency Medicine | Admitting: Emergency Medicine

## 2016-01-23 ENCOUNTER — Emergency Department (HOSPITAL_BASED_OUTPATIENT_CLINIC_OR_DEPARTMENT_OTHER): Payer: Self-pay

## 2016-01-23 DIAGNOSIS — J45909 Unspecified asthma, uncomplicated: Secondary | ICD-10-CM | POA: Insufficient documentation

## 2016-01-23 DIAGNOSIS — M79642 Pain in left hand: Secondary | ICD-10-CM | POA: Insufficient documentation

## 2016-01-23 MED ORDER — NAPROXEN 500 MG PO TABS
500.0000 mg | ORAL_TABLET | Freq: Two times a day (BID) | ORAL | 0 refills | Status: DC
Start: 2016-01-23 — End: 2020-01-29

## 2016-01-23 NOTE — ED Provider Notes (Signed)
[facility] Provider Note   CSN: [id number] Arrival date & time: 01/23/16  1813  By signing my name below, I, Emmanuella Mensah, attest that this documentation has been prepared under the direction and in the presence of Zaul Hubers, PA-C. Electronically Signed: Angelene Giovanni, ED Scribe. 01/23/16. 7:53 PM.   History   Chief Complaint Chief Complaint  Patient presents with  . Hand Pain   HPI Comments: Hannah Morris is a 30 y.o. female who presents to the Emergency Department complaining of gradually worsening moderate bilateral hand pain (worse to left hand) that intermittently radiates up her left shoulder onset 2 days. She reports associated swelling to the left middle finger and difficulty extending her fingers on her left hand. No alleviating factors noted. Pt has not tried any medications PTA. She denies any recent known injuries or trauma but states that she does repetitive lifting at work. No alleviating factors noted. Pt has not tried any medications PTA. She has NKDA. She denies any recent long car/plane travel, birth control use, or surgeries. She also denies any fever, chills, vomiting, numbness/tingling in left hand, weakness, open wounds, or any other symptoms.   The history is provided by the patient. No language interpreter was used.    Past Medical History:  Diagnosis Date  . Asthma   . Gestational diabetes     There are no active problems to display for this patient.   Past Surgical History:  Procedure Laterality Date  . TONSILLECTOMY AND ADENOIDECTOMY      OB History    Gravida Para Term Preterm AB Living   4 3 3     3    SAB TAB Ectopic Multiple Live Births                   Home Medications    Prior to Admission medications   Medication Sig Start Date End Date Taking? Authorizing Provider  glyBURIDE (DIABETA) 2.5 MG tablet Take 2.5 mg by mouth daily with breakfast.    Historical Provider, MD  metroNIDAZOLE (FLAGYL) 500 MG  tablet Take 1 tablet (500 mg total) by mouth 2 (two) times daily. Patient not taking: Reported on 01/01/2015 09/28/14   Lavera Guise, MD  ondansetron (ZOFRAN) 4 MG tablet Take 1 tablet (4 mg total) by mouth every 6 (six) hours as needed for nausea or vomiting. Patient not taking: Reported on 01/01/2015 09/28/14   Lavera Guise, MD  Prenatal Vit-Fe Fumarate-FA (PRENATAL VITAMIN PO) Take by mouth.    Historical Provider, MD  sulfamethoxazole-trimethoprim (BACTRIM DS,SEPTRA DS) 800-160 MG per tablet Take 1 tablet by mouth 2 (two) times daily.    Historical Provider, MD    Family History No family history on file.  Social History Social History  Substance Use Topics  . Smoking status: Never Smoker  . Smokeless tobacco: Never Used  . Alcohol use No     Allergies   Patient has no known allergies.   Review of Systems Review of Systems  Constitutional: Negative for chills and fever.  Gastrointestinal: Negative for vomiting.  Musculoskeletal: Positive for arthralgias and joint swelling.  Skin: Negative for wound.  Neurological: Negative for weakness and numbness.     Physical Exam Updated Vital Signs BP 161/90   Pulse 60   Temp 98.2 F (36.8 C) (Oral)   Resp 20   Ht 5\' 2"  (1.575 m)   Wt 280 lb (127 kg)   LMP 01/08/2016   SpO2 97%   BMI 51.21 kg/m  Physical Exam  Constitutional: She is oriented to person, place, and time. She appears well-developed and well-nourished. No distress.  HENT:  Head: Normocephalic and atraumatic.  Eyes: Conjunctivae and EOM are normal.  Neck: Neck supple. No tracheal deviation present.  Cardiovascular: Normal rate.   Pulmonary/Chest: Effort normal. No respiratory distress.  Musculoskeletal: Normal range of motion.  Mild swelling noted to the fingers and hand of the left hand. Tenderness to palpation over anterior wrist, and anterior hand. Pain with extension of the fingers. Full range of motion with flexion of the fingers. Cap refill is 2 seconds  distally. Distal radial pulses intact. Positive Phalen and  Tinel sign  Neurological: She is alert and oriented to person, place, and time.  Skin: Skin is warm and dry.  Psychiatric: She has a normal mood and affect. Her behavior is normal.  Nursing note and vitals reviewed.    ED Treatments / Results  DIAGNOSTIC STUDIES: Oxygen Saturation is 97% on RA, adequate by my interpretation.    COORDINATION OF CARE: 7:50 PM- Pt advised of plan for treatment and pt agrees. Pt will receive [place] left arm for further evaluation.   Labs (all labs ordered are listed, but only abnormal results are displayed) Labs Reviewed - No data to display  EKG  EKG Interpretation None       Radiology US Venous Img Upper Uni Left  Result Date: 01/23/2016 CLINICAL DATA:  LEFT hand swelling for 1 week. Intermittent LEFT shoulder pain for 2 days. EXAM: LEFT UPPER EXTREMITY VENOUS DOPPLER ULTRASOUND TECHNIQUE: Gray-scale sonography with graded compression, as well as color Doppler and duplex ultrasound were performed to evaluate the upper extremity deep venous system from the level of the subclavian vein and including the jugular, axillary, basilic, radial, ulnar and upper cephalic vein. Spectral Doppler was utilized to evaluate flow at rest and with distal augmentation maneuvers. COMPARISON:  None. FINDINGS: Contralateral Subclavian Vein: Respiratory phasicity is normal and symmetric with the symptomatic side. No evidence of thrombus. Normal compressibility. Internal Jugular Vein: No evidence of thrombus. Normal compressibility, respiratory phasicity and response to augmentation. Subclavian Vein: No evidence of thrombus. Normal compressibility, respiratory phasicity and response to augmentation. Axillary Vein: No evidence of thrombus. Normal compressibility, respiratory phasicity and response to augmentation. Cephalic Vein: No evidence of thrombus. Normal compressibility, respiratory phasicity and response to  augmentation. Basilic Vein: No evidence of thrombus. Normal compressibility, respiratory phasicity and response to augmentation. Brachial Veins: No evidence of thrombus. Normal compressibility, respiratory phasicity and response to augmentation. Radial Veins: No evidence of thrombus. Normal compressibility, respiratory phasicity and response to augmentation. Ulnar Veins: No evidence of thrombus. Normal compressibility, respiratory phasicity and response to augmentation. Other Findings:  None visualized. IMPRESSION: No evidence of LEFT upper extremity deep venous thrombosis. Electronically Signed   By: Awilda Metro M.D.   On: 01/23/2016 21:32    Procedures Procedures (including critical care time)  Medications Ordered in ED Medications - No data to display   Initial Impression / Assessment and Plan / ED Course  Jaynie Crumble, PA-C has reviewed the triage vital signs and the nursing notes.  Pertinent labs & imaging results that were available during my care of the patient were reviewed by me and considered in my medical decision making (see chart for details).  Clinical Course   Patient with a traumatic wrist and hand pain, as well as swelling. We'll get venous Doppler to rule out DVT.   Venous Doppler is negative. Will place in the wrist splints, question  carpal tunnel or another radicular pain versus tendinitis. Patient does a lot of lifting at work. Instructed not to lift anything heavy with that hand. Follow-up with family doctor if not improving.   Vitals:   01/23/16 1830 01/23/16 1832 01/23/16 2114 01/23/16 2212  BP:  161/90 146/73 133/86  Pulse:  60 65 85  Resp:  20 18 18   Temp:  98.2 F (36.8 C) 98.8 F (37.1 C)   TempSrc:  Oral Oral   SpO2:  97% 98% 100%  Weight: 127 kg     Height: 5\' 2"  (1.575 m)       Final Clinical Impressions(s) / ED Diagnoses   Final diagnoses:  Left hand pain    New Prescriptions New Prescriptions   NAPROXEN (NAPROSYN) 500 MG TABLET     Take 1 tablet (500 mg total) by mouth 2 (two) times daily.   I personally performed the services described in this documentation, which was scribed in my presence. The recorded information has been reviewed and is accurate.    Jaynie Crumbleatyana Cella Cappello, PA-C 01/23/16 2226    Nira ConnPedro Eduardo Cardama, MD 01/24/16 [phone]

## 2016-01-23 NOTE — Discharge Instructions (Signed)
Wear a Velcro splint day and night for 1-2 weeks. If not improving please follow-up with family doctor for recheck and for further testing.

## 2016-01-23 NOTE — ED Triage Notes (Signed)
Pain in both of her hands x 2 days. Shooting pain up her arms at times.

## 2016-01-27 ENCOUNTER — Encounter (HOSPITAL_BASED_OUTPATIENT_CLINIC_OR_DEPARTMENT_OTHER): Payer: Self-pay | Admitting: *Deleted

## 2016-01-27 ENCOUNTER — Emergency Department (HOSPITAL_BASED_OUTPATIENT_CLINIC_OR_DEPARTMENT_OTHER): Payer: Self-pay

## 2016-01-27 ENCOUNTER — Emergency Department (HOSPITAL_BASED_OUTPATIENT_CLINIC_OR_DEPARTMENT_OTHER)
Admission: EM | Admit: 2016-01-27 | Discharge: 2016-01-27 | Disposition: A | Discharge status: Home / Self Care | Payer: Self-pay | Attending: Emergency Medicine | Admitting: Emergency Medicine

## 2016-01-27 DIAGNOSIS — Z79899 Other long term (current) drug therapy: Secondary | ICD-10-CM | POA: Insufficient documentation

## 2016-01-27 DIAGNOSIS — Z7984 Long term (current) use of oral hypoglycemic drugs: Secondary | ICD-10-CM | POA: Insufficient documentation

## 2016-01-27 DIAGNOSIS — J45909 Unspecified asthma, uncomplicated: Secondary | ICD-10-CM | POA: Insufficient documentation

## 2016-01-27 DIAGNOSIS — M79605 Pain in left leg: Secondary | ICD-10-CM | POA: Insufficient documentation

## 2016-01-27 DIAGNOSIS — Z791 Long term (current) use of non-steroidal anti-inflammatories (NSAID): Secondary | ICD-10-CM | POA: Insufficient documentation

## 2016-01-27 MED ORDER — ENOXAPARIN SODIUM 150 MG/ML ~~LOC~~ SOLN: 1.0000 mg/kg | Freq: Once | SUBCUTANEOUS | Status: DC

## 2016-01-27 MED ORDER — TRAMADOL HCL 50 MG PO TABS: 50.0000 mg | ORAL_TABLET | Freq: Four times a day (QID) | ORAL | 0 refills | Status: DC | PRN

## 2016-01-27 NOTE — ED Notes (Signed)
Patient transported to Ultrasound 

## 2016-01-27 NOTE — Discharge Instructions (Signed)
You have been evaluated for your left leg pain.  No evidence of any blood clot or infection in your leg.  Please take tramadol as needed for pain.  Eat a banana daily for 1 week as it may help with the tingling sensation.  Follow up with your doctor for further care.

## 2016-01-27 NOTE — ED Notes (Signed)
Pt wanting to leave AMA. Greta DoomBowie, PA-C notified and he is at bedside now to evaluate pt.

## 2016-01-27 NOTE — ED Provider Notes (Signed)
[facility]   CSN: [id number] Arrival date & time: 01/27/16  1629 By signing my name below, I, Hannah Morris, attest that this documentation has been prepared under the direction and in the presence of Hannah HelperBowie Iliya Spivack, PA-C.Marland Kitchen. Electronically Signed: Linus GalasMaharshi Morris, ED Scribe. 01/27/16. 9:10 PM.   History   Chief Complaint Chief Complaint  Patient presents with  . Numbness   The history is provided by the patient. No language interpreter was used.   HPI Comments: Hannah ManifoldJessica Morris is a 30 y.o. female who presents to the Emergency Department with no pertinent PMHx complaining of constant left lower leg paresthesia for the past 2 days. Pt also reports calf and ankle pain and swelling. Pt states her symptoms are worse while lying down. Pt tried elevating her extremity and icing it  with no relief. Pt denies any fevers, chills, CP, SOB, N/V/D or any other symptoms at this time. Pt denies any injuries. Pt denies hx of PE or DVT. Pt denies any recent surgeries or long travels. Pt denies being on birth control or hormone therapy.   Past Medical History:  Diagnosis Date  . Asthma   . Gestational diabetes     There are no active problems to display for this patient.   Past Surgical History:  Procedure Laterality Date  . TONSILLECTOMY AND ADENOIDECTOMY      OB History    Gravida Para Term Preterm AB Living   4 3 3     3    SAB TAB Ectopic Multiple Live Births                   Home Medications    Prior to Admission medications   Medication Sig Start Date End Date Taking? Authorizing Provider  glyBURIDE (DIABETA) 2.5 MG tablet Take 2.5 mg by mouth daily with breakfast.    Historical Provider, MD  metroNIDAZOLE (FLAGYL) 500 MG tablet Take 1 tablet (500 mg total) by mouth 2 (two) times daily. Patient not taking: Reported on 01/01/2015 09/28/14   Lavera Guiseana Duo Liu, MD  naproxen (NAPROSYN) 500 MG tablet Take 1 tablet (500 mg total) by mouth 2 (two) times daily. 01/23/16    Tatyana Kirichenko, PA-C  ondansetron (ZOFRAN) 4 MG tablet Take 1 tablet (4 mg total) by mouth every 6 (six) hours as needed for nausea or vomiting. Patient not taking: Reported on 01/01/2015 09/28/14   Lavera Guiseana Duo Liu, MD  Prenatal Vit-Fe Fumarate-FA (PRENATAL VITAMIN PO) Take by mouth.    Historical Provider, MD  sulfamethoxazole-trimethoprim (BACTRIM DS,SEPTRA DS) 800-160 MG per tablet Take 1 tablet by mouth 2 (two) times daily.    Historical Provider, MD    Family History No family history on file.  Social History Social History  Substance Use Topics  . Smoking status: Never Smoker  . Smokeless tobacco: Never Used  . Alcohol use No     Allergies   Patient has no known allergies.  Review of Systems Review of Systems  Constitutional: Negative for chills and fever.  Respiratory: Negative for shortness of breath.   Cardiovascular: Negative for chest pain.  Gastrointestinal: Negative for diarrhea, nausea and vomiting.  Musculoskeletal: Positive for myalgias.  Neurological:       Positive paresthesias  All other systems reviewed and are negative.  Physical Exam Updated Vital Signs BP (!) 149/104 (BP Location: Right Arm)   Pulse 73   Temp 97.5 F (36.4 C) (Oral)   Resp 18   Ht 5\' 2"  (1.575 m)  Wt 280 lb (127 kg)   LMP 01/08/2016   SpO2 100%   BMI 51.21 kg/m   Physical Exam  Constitutional: She is oriented to person, place, and time. She appears well-developed and well-nourished. No distress.  HENT:  Head: Normocephalic and atraumatic.  Eyes: Conjunctivae and EOM are normal.  Neck: Normal range of motion.  Cardiovascular: Normal rate, regular rhythm, normal heart sounds, intact distal pulses and normal pulses.   Pulmonary/Chest: Effort normal and breath sounds normal.  Abdominal: Soft. She exhibits no distension. There is no tenderness.  Musculoskeletal: Normal range of motion.  Calf tenderness to palpation. No foot drop. 5/5 strength. Neurovascularly intact.     Neurological: She is alert and oriented to person, place, and time. She displays normal reflexes.  Skin: Skin is warm and dry. Capillary refill takes less than 2 seconds.  Psychiatric: She has a normal mood and affect. Judgment normal.  Nursing note and vitals reviewed.  ED Treatments / Results  DIAGNOSTIC STUDIES: Oxygen Saturation is 100% on room air, normal by my interpretation.    COORDINATION OF CARE: 9:10 PM Discussed treatment plan with pt at bedside and pt agreed to plan.  Labs (all labs ordered are listed, but only abnormal results are displayed) Labs Reviewed - No data to display  EKG  EKG Interpretation None       Radiology US Venous Img Lower Unilateral Left  Result Date: 01/27/2016 CLINICAL DATA:  Left lower extremity pain and swelling. EXAM: LEFT LOWER EXTREMITY VENOUS DOPPLER ULTRASOUND TECHNIQUE: Gray-scale sonography with graded compression, as well as color Doppler and duplex ultrasound were performed to evaluate the lower extremity deep venous systems from the level of the common femoral vein and including the common femoral, femoral, profunda femoral, popliteal and calf veins including the posterior tibial, peroneal and gastrocnemius veins when visible. The superficial great saphenous vein was also interrogated. Spectral Doppler was utilized to evaluate flow at rest and with distal augmentation maneuvers in the common femoral, femoral and popliteal veins. COMPARISON:  None. FINDINGS: Contralateral Common Femoral Vein: Respiratory phasicity is normal and symmetric with the symptomatic side. No evidence of thrombus. Normal compressibility. Common Femoral Vein: No evidence of thrombus. Normal compressibility, respiratory phasicity and response to augmentation. Saphenofemoral Junction: No evidence of thrombus. Normal compressibility and flow on color Doppler imaging. Profunda Femoral Vein: No evidence of thrombus. Normal compressibility and flow on color Doppler imaging.  Femoral Vein: No evidence of thrombus. Normal compressibility, respiratory phasicity and response to augmentation. Popliteal Vein: No evidence of thrombus. Normal compressibility, respiratory phasicity and response to augmentation. Calf Veins: No evidence of thrombus. Peroneal vein is not well visualized. Normal compressibility and flow on color Doppler imaging. Superficial Great Saphenous Vein: No evidence of thrombus. Normal compressibility and flow on color Doppler imaging. Venous Reflux:  None. Other Findings:  None. IMPRESSION: No evidence of left lower extremity deep venous thrombosis. Electronically Signed   By: Rubye Oaks M.D.   On: 01/27/2016 22:14    Procedures Procedures (including critical care time)  Medications Ordered in ED Medications - No data to display   Initial Impression / Assessment and Plan / ED Course  I have reviewed the triage vital signs and the nursing notes.  Pertinent labs & imaging results that were available during my care of the patient were reviewed by me and considered in my medical decision making (see chart for details).  Clinical Course    BP 145/97 (BP Location: Right Arm)   Pulse 66  Temp 97.5 F (36.4 C) (Oral)   Resp 18   Ht 5\' 2"  (1.575 m)   Wt 127 kg   LMP 01/08/2016   SpO2 100%   BMI 51.21 kg/m    Final Clinical Impressions(s) / ED Diagnoses   Final diagnoses:  Left leg pain    New Prescriptions New Prescriptions   TRAMADOL (ULTRAM) 50 MG TABLET    Take 1 tablet (50 mg total) by mouth every 6 (six) hours as needed for moderate pain.   I personally performed the services described in this documentation, which was scribed in my presence. The recorded information has been reviewed and is accurate.   Pt here with L leg pain and tingling sensation.  DVT study negative.  Pt able to ambulate, NVI.  No signs of infection.  Recommend f/u with PCP for further care.     Hannah Helper, PA-C 01/27/16 2240    Canary Brim Tegeler,  MD 01/28/16 [phone]

## 2016-01-27 NOTE — ED Notes (Signed)
Pt agreed to stay for an US the LLE

## 2016-01-27 NOTE — ED Notes (Signed)
Pt teaching provided on medications that may cause drowsiness. Pt instructed not to drive or operate heavy machinery while taking the prescribed medication. Pt verbalized understanding.   

## 2016-01-27 NOTE — ED Triage Notes (Signed)
Constant numbess in her left lower leg x 2 days. She is ambulatory from w/a to triage.

## 2016-01-27 NOTE — ED Notes (Signed)
ED Provider at bedside. 

## 2016-03-04 ENCOUNTER — Encounter: Payer: Self-pay | Admitting: Podiatry

## 2016-03-04 ENCOUNTER — Ambulatory Visit (INDEPENDENT_AMBULATORY_CARE_PROVIDER_SITE_OTHER): Payer: BLUE CROSS/BLUE SHIELD | Admitting: Podiatry

## 2016-03-04 ENCOUNTER — Telehealth: Payer: Self-pay | Admitting: *Deleted

## 2016-03-04 VITALS — Ht 62.0 in | Wt 285.0 lb

## 2016-03-04 DIAGNOSIS — M79672 Pain in left foot: Secondary | ICD-10-CM

## 2016-03-04 DIAGNOSIS — M21969 Unspecified acquired deformity of unspecified lower leg: Secondary | ICD-10-CM

## 2016-03-04 DIAGNOSIS — M7742 Metatarsalgia, left foot: Secondary | ICD-10-CM | POA: Diagnosis not present

## 2016-03-04 DIAGNOSIS — M659 Synovitis and tenosynovitis, unspecified: Secondary | ICD-10-CM | POA: Diagnosis not present

## 2016-03-04 MED ORDER — HYDROCODONE-IBUPROFEN 7.5-200 MG PO TABS: 1.0000 | ORAL_TABLET | Freq: Four times a day (QID) | ORAL | 0 refills | Status: DC | PRN

## 2016-03-04 NOTE — Telephone Encounter (Signed)
Patient called and requested pain medication for foot pain.

## 2016-03-04 NOTE — Progress Notes (Signed)
   Radiographic on left foot. Severe elevated first metatarsal bone. SUBJECTIVE: 31 y.o. year old female presents stating that Left foot is numb for over a month. When walk, feel like stepping on something. Now shooting pain runs from middle of foot down to toes on left foot. 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 Usually wears flat shoes.  REVIEW OF SYSTEMS: Pertinent items noted in HPI and remainder of comprehensive ROS otherwise negative.  OBJECTIVE: DERMATOLOGIC EXAMINATION: No abnormal findings.   VASCULAR EXAMINATION OF LOWER LIMBS: All pedal pulses are palpable with normal pulsation.  Capillary Filling times within 3 seconds in all digits.  No edema or erythema noted. Temperature gradient from tibial crest to dorsum of foot is within normal bilateral.  NEUROLOGIC EXAMINATION OF THE LOWER LIMBS: Achilles DTR is present and within normal. Monofilament (Semmes-Weinstein 10-gm) sensory testing positive 6 out of 6, bilateral. Vibratory sensations(128Hz  turning fork) intact at medial and lateral forefoot bilateral.  Sharp and Dull discriminatory sensations at the plantar ball of hallux is intact bilateral.   MUSCULOSKELETAL EXAMINATION: Positive for elevated first metatarsal bone bilateral. Normal ankle joint range of motion bilateral.  RADIOGRAPHIC STUDIES:  AP View:  Rectus foot without gross deformity left foot. Lateral view:  Severe elevated first metatarsal bone left foot.   ASSESSMENT: Metatarsus primus elevatus bilateral. Lateral weight shifting bilateral. Tenosynovitis mid foot left.  PLAN: Reviewed findings and available treatment options, conservative and none conservative. Proper shoe gear discussed. Return for custom orthotics.

## 2016-03-04 NOTE — Patient Instructions (Signed)
Seen for pain in left foot. Noted of severely deviated first metatarsal bone with abnormal weight shifting. May benefit from Custom orthotics. Return for orthotic prep.

## 2017-07-06 ENCOUNTER — Encounter (HOSPITAL_BASED_OUTPATIENT_CLINIC_OR_DEPARTMENT_OTHER): Payer: Self-pay

## 2017-07-06 ENCOUNTER — Emergency Department (HOSPITAL_BASED_OUTPATIENT_CLINIC_OR_DEPARTMENT_OTHER): Payer: 59

## 2017-07-06 ENCOUNTER — Emergency Department (HOSPITAL_BASED_OUTPATIENT_CLINIC_OR_DEPARTMENT_OTHER)
Admission: EM | Admit: 2017-07-06 | Discharge: 2017-07-06 | Disposition: A | Discharge status: Home / Self Care | Payer: 59 | Attending: Emergency Medicine | Admitting: Emergency Medicine

## 2017-07-06 ENCOUNTER — Other Ambulatory Visit: Payer: Self-pay

## 2017-07-06 DIAGNOSIS — Z79899 Other long term (current) drug therapy: Secondary | ICD-10-CM | POA: Diagnosis not present

## 2017-07-06 DIAGNOSIS — R079 Chest pain, unspecified: Secondary | ICD-10-CM | POA: Diagnosis not present

## 2017-07-06 DIAGNOSIS — J45909 Unspecified asthma, uncomplicated: Secondary | ICD-10-CM | POA: Diagnosis not present

## 2017-07-06 LAB — CBC
HCT: 35.4 % — ABNORMAL LOW (ref 36.0–46.0)
Hemoglobin: 12.2 g/dL (ref 12.0–15.0)
MCH: 33.2 pg (ref 26.0–34.0)
MCHC: 34.5 g/dL (ref 30.0–36.0)
MCV: 96.2 fL (ref 78.0–100.0)
Platelets: 305 K/uL (ref 150–400)
RBC: 3.68 MIL/uL — ABNORMAL LOW (ref 3.87–5.11)
RDW: 12.2 % (ref 11.5–15.5)
WBC: 11.7 K/uL — ABNORMAL HIGH (ref 4.0–10.5)

## 2017-07-06 LAB — BASIC METABOLIC PANEL WITH GFR
Anion gap: 7 (ref 5–15)
BUN: 11 mg/dL (ref 6–20)
CO2: 22 mmol/L (ref 22–32)
Calcium: 8.4 mg/dL — ABNORMAL LOW (ref 8.9–10.3)
Chloride: 107 mmol/L (ref 101–111)
Creatinine, Ser: 0.81 mg/dL (ref 0.44–1.00)
GFR calc Af Amer: >60 mL/min
GFR calc non Af Amer: >60 mL/min
Glucose, Bld: 112 mg/dL — ABNORMAL HIGH (ref 65–99)
Potassium: 3.7 mmol/L (ref 3.5–5.1)
Sodium: 136 mmol/L (ref 135–145)

## 2017-07-06 LAB — TROPONIN I
Troponin I: <0.03 ng/mL (ref <0.03)
Troponin I: <0.03 ng/mL

## 2017-07-06 LAB — PREGNANCY, URINE: PREG TEST UR: NEGATIVE

## 2017-07-06 LAB — D-DIMER, QUANTITATIVE: D-Dimer, Quant: <0.27 ug{FEU}/mL (ref 0.00–0.50)

## 2017-07-06 MED ORDER — METHOCARBAMOL 500 MG PO TABS: 500.0000 mg | ORAL_TABLET | Freq: Two times a day (BID) | ORAL | 0 refills | Status: DC

## 2017-07-06 MED ORDER — KETOROLAC TROMETHAMINE 30 MG/ML IJ SOLN
30.0000 mg | Freq: Once | INTRAMUSCULAR | Status: AC
  Administered 2017-07-06: 30 mg via INTRAVENOUS
  Filled 2017-07-06: qty 1

## 2017-07-06 MED ORDER — METHOCARBAMOL 500 MG PO TABS
500.0000 mg | ORAL_TABLET | Freq: Once | ORAL | Status: AC
  Administered 2017-07-06: 500 mg via ORAL
  Filled 2017-07-06: qty 1

## 2017-07-06 NOTE — ED Provider Notes (Signed)
[facility] EMERGENCY DEPARTMENT Provider Note   CSN: [id number] Arrival date & time: 07/06/17  1855     History   Chief Complaint Chief Complaint  Patient presents with  . Chest Pain    HPI Hannah Morris is a 32 y.o. female past medical history of asthma, gestational diabetes who presents for evaluation of 1 week of intermittent right-sided chest pain.  Patient reports that chest pain is a sharp right-sided pain that does not radiate.  Patient reports it is worse when she moves around.  Patient states that her pain will come on at random times.  She states that she has had pain both while sitting still and when she gets up and walk around.  Patient reports that when she does have the pain, she gets short of breath that she states is worse with exertion.  She denies any associated nausea, vomiting, diaphoresis.  Patient reports that she has not taken anything for the pain.  Patient denies any preceding trauma, injury, fall.  Patient states that she does smoke.  She reports she smokes broccoli 4 cigarettes a day.  She denies any cocaine use.  Patient denies any personal cardiac history.  Patient does not have a history of high blood pressure, diabetes. She denies recent immobilization, prior history of DVT/PE, recent surgery, leg swelling, or long travel. She has a nexplanon implant.   The history is provided by the patient.    Past Medical History:  Diagnosis Date  . Asthma   . Gestational diabetes     Patient Active Problem List   Diagnosis Date Noted  . Foot pain, left 03/04/2016    Past Surgical History:  Procedure Laterality Date  . TONSILLECTOMY AND ADENOIDECTOMY       OB History    Gravida  4   Para  3   Term  3   Preterm      AB      Living  3     SAB      TAB      Ectopic      Multiple      Live Births               Home Medications    Prior to Admission medications   Medication Sig Start Date End Date Taking? Authorizing  Provider  glyBURIDE (DIABETA) 2.5 MG tablet Take 2.5 mg by mouth daily with breakfast.    [provider]  HYDROcodone-ibuprofen (VICOPROFEN) 7.5-200 MG tablet Take 1 tablet by mouth every 6 (six) hours as needed for moderate pain. 03/04/16   Sheard, Myeong O, DPM  methocarbamol (ROBAXIN) 500 MG tablet Take 1 tablet (500 mg total) by mouth 2 (two) times daily. 07/06/17   Maxwell Caul, PA-C  metroNIDAZOLE (FLAGYL) 500 MG tablet Take 1 tablet (500 mg total) by mouth 2 (two) times daily. Patient not taking: Reported on 03/04/2016 09/28/14   Lavera Guise, MD  naproxen (NAPROSYN) 500 MG tablet Take 1 tablet (500 mg total) by mouth 2 (two) times daily. Patient not taking: Reported on 03/04/2016 01/23/16   Jaynie Crumble, PA-C  ondansetron (ZOFRAN) 4 MG tablet Take 1 tablet (4 mg total) by mouth every 6 (six) hours as needed for nausea or vomiting. Patient not taking: Reported on 03/04/2016 09/28/14   Lavera Guise, MD  Prenatal Vit-Fe Fumarate-FA (PRENATAL VITAMIN PO) Take by mouth.    [provider]  sulfamethoxazole-trimethoprim (BACTRIM DS,SEPTRA DS) 800-160 MG per tablet Take 1  tablet by mouth 2 (two) times daily.    [provider]  traMADol (ULTRAM) 50 MG tablet Take 1 tablet (50 mg total) by mouth every 6 (six) hours as needed for moderate pain. Patient not taking: Reported on 03/04/2016 01/27/16   Fayrene Helper, PA-C    Family History No family history on file.  Social History Social History   Tobacco Use  . Smoking status: Never Smoker  . Smokeless tobacco: Never Used  Substance Use Topics  . Alcohol use: No  . Drug use: No     Allergies   Patient has no known allergies.   Review of Systems Review of Systems  Constitutional: Negative for chills and fever.  Respiratory: Positive for shortness of breath. Negative for cough.   Cardiovascular: Positive for chest pain. Negative for leg swelling.  Gastrointestinal: Negative for abdominal pain, diarrhea,  nausea and vomiting.  Genitourinary: Negative for dysuria and hematuria.  Musculoskeletal: Negative for back pain and neck pain.  Neurological: Negative for dizziness, weakness, numbness and headaches.  All other systems reviewed and are negative.    Physical Exam Updated Vital Signs BP (!) 152/98   Pulse 83   Temp 98.2 F (36.8 C) (Oral)   Resp 20   Ht 5\' 2"  (1.575 m)   Wt 124.7 kg (275 lb)   LMP  (LMP Unknown)   SpO2 98%   BMI 50.30 kg/m   Physical Exam  Constitutional: She is oriented to person, place, and time. She appears well-developed and well-nourished.  HENT:  Head: Normocephalic and atraumatic.  Mouth/Throat: Oropharynx is clear and moist and mucous membranes are normal.  Eyes: Pupils are equal, round, and reactive to light. Conjunctivae, EOM and lids are normal.  Neck: Full passive range of motion without pain.  Cardiovascular: Normal rate, regular rhythm, normal heart sounds and normal pulses. Exam reveals no gallop and no friction rub.  No murmur heard. Pulses:      Radial pulses are 2+ on the right side, and 2+ on the left side.       Dorsalis pedis pulses are 2+ on the right side, and 2+ on the left side.  Pulmonary/Chest: Effort normal and breath sounds normal. She has no wheezes.  Lungs clear to auscultation bilaterally.  Symmetric chest rise.  No wheezing, rales, rhonchi.  Pain is reproduced on palpation of right anterior chest.  No deformity or crepitus noted.  Pain is reproduced with movement of her upper extremities.  Abdominal: Soft. Normal appearance. There is no tenderness. There is no rigidity and no guarding.  Musculoskeletal: Normal range of motion.  Bilateral lower extremities are symmetric in appearance.  Neurological: She is alert and oriented to person, place, and time.  Skin: Skin is warm and dry. Capillary refill takes less than 2 seconds.  Psychiatric: She has a normal mood and affect. Her speech is normal.  Nursing note and vitals  reviewed.    ED Treatments / Results  Labs (all labs ordered are listed, but only abnormal results are displayed) Labs Reviewed  BASIC METABOLIC PANEL - Abnormal; Notable for the following components:      Result Value   Glucose, Bld 112 (*)    Calcium 8.4 (*)    All other components within normal limits  CBC - Abnormal; Notable for the following components:   WBC 11.7 (*)    RBC 3.68 (*)    HCT 35.4 (*)    All other components within normal limits  TROPONIN I  PREGNANCY, URINE  D-DIMER, QUANTITATIVE (NOT AT [facility])  TROPONIN I    EKG EKG Interpretation  Date/Time:  Tuesday Jul 06 2017 19:01:57 EDT Ventricular Rate:  67 PR Interval:  130 QRS Duration: 84 QT Interval:  386 QTC Calculation: 407 R Axis:   101 Text Interpretation:  Normal sinus rhythm Rightward axis Borderline ECG Confirmed by Loren Racer ([id number]) on 07/06/2017 11:04:48 PM   Radiology Dg Chest 2 View  Result Date: 07/06/2017 CLINICAL DATA:  Right chest pain and shortness of breath for 1 week. EXAM: CHEST - 2 VIEW COMPARISON:  PA and lateral chest 03/08/2013. FINDINGS: Lungs clear. Heart size normal. No pneumothorax or pleural fluid. No bony abnormality. IMPRESSION: Normal chest. Electronically Signed   By: Drusilla Kanner M.D.   On: 07/06/2017 19:48    Procedures Procedures (including critical care time)  Medications Ordered in ED Medications  methocarbamol (ROBAXIN) tablet 500 mg (has no administration in time range)  ketorolac (TORADOL) 30 MG/ML injection 30 mg (30 mg Intravenous Given 07/06/17 2125)     Initial Impression / Assessment and Plan / ED Course  I have reviewed the triage vital signs and the nursing notes.  Pertinent labs & imaging results that were available during my care of the patient were reviewed by me and considered in my medical decision making (see chart for details).     32 year old female who presents for evaluation of 1 week of intermittent right-sided chest pain.   Worse with movement and palpation.  Has some associated shortness of breath.  No personal cardiac history.  She does smoke.  No family cardiac history. Patient is afebrile, non-toxic appearing, sitting comfortably on examination table. Vital signs reviewed and stable.  On exam, patient has tenderness to palpation noted to the right anterior chest wall.  Pain is reproduced with palpation with movement of her upper extremities.  Lungs clear to auscultation bilaterally.  Consider ACS etiology versus infectious etiology versus muscle strain.  Low suspicion for PE but not consideration given her complaints of shortness of breath.  History/physical exam is not concerning for section.  Plan to check basic labs, chest x-ray, EKG.  Allergies is provided in the department.  CBC shows slight leukocytosis.  No evidence of anemia.  D-dimer is negative.  Urine pregnancy is negative.  Troponin is negative.  BMP is unremarkable.  Chest x-ray negative for any acute abnormality.  EKG shows normal sinus rhythm.  Given negative d-dimer, no indication for further testing for PE.  Patient is not tachycardic or hypoxic and has no PE risk factors.  Given patient's presentation, risk factors, she has a heart score of 2.  We will plan to delta troponin.  Updated patient on plan.  She is agreeable.  Delta troponin negative.  Discussed results with patient.  Vital signs are stable.  Encourage primary care follow-up in the next 2 to 4 days for further evaluation.  We will plan to treat as muscle strain.  Patient provided with analgesics. Patient had ample opportunity for questions and discussion. All patient's questions were answered with full understanding. Strict return precautions discussed. Patient expresses understanding and agreement to plan.   Final Clinical Impressions(s) / ED Diagnoses   Final diagnoses:  Chest pain, unspecified type    ED Discharge Orders        Ordered    methocarbamol (ROBAXIN) 500 MG tablet  2  times daily     07/06/17 2303       Maxwell Caul, PA-C 07/06/17 2309  Loren Racer, MD 07/10/17 [phone]

## 2017-07-06 NOTE — ED Notes (Signed)
Pt verbalizes understanding of d/c instructions and denies any further needs at this time. 

## 2017-07-06 NOTE — Discharge Instructions (Signed)
You can take Tylenol or Ibuprofen as directed for pain. You can alternate Tylenol and Ibuprofen every 4 hours. If you take Tylenol at 1pm, then you can take Ibuprofen at 5pm. Then you can take Tylenol again at 9pm.   Take Robaxin as prescribed. This medication will make you drowsy so do not drive or drink alcohol when taking it.  As we discussed, follow-up with your primary care doctor in the next 2 to 4 days for further evaluation.  Return to the Emergency Department immediately if you experiencing worsening chest pain, difficulty breathing, nausea/vomiting, get very sweaty, headache or any other worsening or concerning symptoms.

## 2017-07-06 NOTE — ED Notes (Signed)
Attempted IV stick without success, obtained blood

## 2017-07-06 NOTE — ED Triage Notes (Signed)
C/o right side CP x 1 week-NAD-steady gait

## 2017-08-31 ENCOUNTER — Encounter (HOSPITAL_BASED_OUTPATIENT_CLINIC_OR_DEPARTMENT_OTHER): Payer: Self-pay

## 2017-08-31 ENCOUNTER — Emergency Department (HOSPITAL_BASED_OUTPATIENT_CLINIC_OR_DEPARTMENT_OTHER): Payer: 59

## 2017-08-31 ENCOUNTER — Other Ambulatory Visit: Payer: Self-pay

## 2017-08-31 ENCOUNTER — Emergency Department (HOSPITAL_BASED_OUTPATIENT_CLINIC_OR_DEPARTMENT_OTHER)
Admission: EM | Admit: 2017-08-31 | Discharge: 2017-08-31 | Disposition: A | Discharge status: Home / Self Care | Payer: 59 | Attending: Emergency Medicine | Admitting: Emergency Medicine

## 2017-08-31 DIAGNOSIS — M940 Chondrocostal junction syndrome [Tietze]: Secondary | ICD-10-CM | POA: Insufficient documentation

## 2017-08-31 DIAGNOSIS — Z79899 Other long term (current) drug therapy: Secondary | ICD-10-CM | POA: Insufficient documentation

## 2017-08-31 DIAGNOSIS — J45909 Unspecified asthma, uncomplicated: Secondary | ICD-10-CM | POA: Insufficient documentation

## 2017-08-31 DIAGNOSIS — F17200 Nicotine dependence, unspecified, uncomplicated: Secondary | ICD-10-CM | POA: Diagnosis not present

## 2017-08-31 DIAGNOSIS — R079 Chest pain, unspecified: Secondary | ICD-10-CM | POA: Diagnosis present

## 2017-08-31 DIAGNOSIS — R0602 Shortness of breath: Secondary | ICD-10-CM | POA: Diagnosis not present

## 2017-08-31 LAB — CBC
HCT: 39.4 % (ref 36.0–46.0)
Hemoglobin: 13.3 g/dL (ref 12.0–15.0)
MCH: 32.3 pg (ref 26.0–34.0)
MCHC: 33.8 g/dL (ref 30.0–36.0)
MCV: 95.6 fL (ref 78.0–100.0)
PLATELETS: 309 10*3/uL (ref 150–400)
RBC: 4.12 MIL/uL (ref 3.87–5.11)
RDW: 11.6 % (ref 11.5–15.5)
WBC: 12.2 10*3/uL — ABNORMAL HIGH (ref 4.0–10.5)

## 2017-08-31 LAB — BASIC METABOLIC PANEL
Anion gap: 9 (ref 5–15)
BUN: 12 mg/dL (ref 6–20)
CALCIUM: 8.8 mg/dL — AB (ref 8.9–10.3)
CO2: 25 mmol/L (ref 22–32)
CREATININE: 0.93 mg/dL (ref 0.44–1.00)
Chloride: 106 mmol/L (ref 98–111)
Glucose, Bld: 97 mg/dL (ref 70–99)
Potassium: 3.8 mmol/L (ref 3.5–5.1)
SODIUM: 140 mmol/L (ref 135–145)

## 2017-08-31 LAB — TROPONIN I

## 2017-08-31 LAB — PREGNANCY, URINE: PREG TEST UR: NEGATIVE

## 2017-08-31 MED ORDER — IBUPROFEN 400 MG PO TABS
ORAL_TABLET | ORAL | Status: AC
  Filled 2017-08-31: qty 1

## 2017-08-31 MED ORDER — IBUPROFEN 200 MG PO TABS
ORAL_TABLET | ORAL | Status: AC
  Filled 2017-08-31: qty 1

## 2017-08-31 MED ORDER — IBUPROFEN 600 MG PO TABS: 600.0000 mg | ORAL_TABLET | Freq: Three times a day (TID) | ORAL | 0 refills | Status: DC

## 2017-08-31 MED ORDER — IBUPROFEN 400 MG PO TABS
600.0000 mg | ORAL_TABLET | Freq: Once | ORAL | Status: AC
  Administered 2017-08-31: 600 mg via ORAL

## 2017-08-31 NOTE — ED Triage Notes (Signed)
C/o right side CP x 5-6 days-NAD-steady gait

## 2017-08-31 NOTE — ED Provider Notes (Signed)
[facility] EMERGENCY DEPARTMENT Provider Note   CSN: [id number] Arrival date & time: 08/31/17  1903     History   Chief Complaint Chief Complaint  Patient presents with  . Chest Pain    HPI Hannah Morris is a 32 y.o. female.  HPI Patient presents with right-sided parasternal chest wall pain for the past 5 to 6 days.  Describes the pain as sharp.  Worse with palpation.  Not associated with food.  States that when the pain occurs she becomes anxious mildly short of breath.  Lasts a few minutes but then improves.  She denies any fever or chills.  She is had no cough.  No recent extended travel or immobilization.  No new lower extremity swelling or pain. Past Medical History:  Diagnosis Date  . Asthma   . Gestational diabetes     Patient Active Problem List   Diagnosis Date Noted  . Foot pain, left 03/04/2016    Past Surgical History:  Procedure Laterality Date  . TONSILLECTOMY AND ADENOIDECTOMY       OB History    Gravida  4   Para  3   Term  3   Preterm      AB      Living  3     SAB      TAB      Ectopic      Multiple      Live Births               Home Medications    Prior to Admission medications   Medication Sig Start Date End Date Taking? Authorizing Provider  glyBURIDE (DIABETA) 2.5 MG tablet Take 2.5 mg by mouth daily with breakfast.    [provider]  HYDROcodone-ibuprofen (VICOPROFEN) 7.5-200 MG tablet Take 1 tablet by mouth every 6 (six) hours as needed for moderate pain. 03/04/16   Sheard, Myeong O, DPM  ibuprofen (ADVIL,MOTRIN) 600 MG tablet Take 1 tablet (600 mg total) by mouth 3 (three) times daily after meals. 08/31/17   Loren Racer, MD  methocarbamol (ROBAXIN) 500 MG tablet Take 1 tablet (500 mg total) by mouth 2 (two) times daily. 07/06/17   Maxwell Caul, PA-C  metroNIDAZOLE (FLAGYL) 500 MG tablet Take 1 tablet (500 mg total) by mouth 2 (two) times daily. Patient not taking: Reported on 03/04/2016  09/28/14   Lavera Guise, MD  naproxen (NAPROSYN) 500 MG tablet Take 1 tablet (500 mg total) by mouth 2 (two) times daily. Patient not taking: Reported on 03/04/2016 01/23/16   Jaynie Crumble, PA-C  ondansetron (ZOFRAN) 4 MG tablet Take 1 tablet (4 mg total) by mouth every 6 (six) hours as needed for nausea or vomiting. Patient not taking: Reported on 03/04/2016 09/28/14   Lavera Guise, MD  Prenatal Vit-Fe Fumarate-FA (PRENATAL VITAMIN PO) Take by mouth.    [provider]  sulfamethoxazole-trimethoprim (BACTRIM DS,SEPTRA DS) 800-160 MG per tablet Take 1 tablet by mouth 2 (two) times daily.    [provider]  traMADol (ULTRAM) 50 MG tablet Take 1 tablet (50 mg total) by mouth every 6 (six) hours as needed for moderate pain. Patient not taking: Reported on 03/04/2016 01/27/16   Fayrene Helper, PA-C    Family History No family history on file.  Social History Social History   Tobacco Use  . Smoking status: Current Every Day Smoker  . Smokeless tobacco: Never Used  Substance Use Topics  . Alcohol use: Yes  Comment: occ  . Drug use: No     Allergies   Patient has no known allergies.   Review of Systems Review of Systems  Constitutional: Negative for chills, fatigue and fever.  Respiratory: Positive for shortness of breath. Negative for cough and chest tightness.   Cardiovascular: Positive for chest pain. Negative for palpitations and leg swelling.  Gastrointestinal: Negative for abdominal pain, constipation, diarrhea, nausea and vomiting.  Genitourinary: Negative for dysuria, flank pain and frequency.  Musculoskeletal: Negative for back pain, myalgias and neck pain.  Skin: Negative for rash.  Neurological: Negative for dizziness, light-headedness, numbness and headaches.  All other systems reviewed and are negative.    Physical Exam Updated Vital Signs BP (!) 152/81 (BP Location: Left Arm)   Pulse 63   Temp 98.1 F (36.7 C) (Oral)   Resp 18   SpO2 100%     Physical Exam  Constitutional: She is oriented to person, place, and time. She appears well-developed and well-nourished.  HENT:  Head: Normocephalic and atraumatic.  Mouth/Throat: Oropharynx is clear and moist. No oropharyngeal exudate.  Eyes: Pupils are equal, round, and reactive to light. EOM are normal.  Neck: Normal range of motion. Neck supple. No JVD present.  Cardiovascular: Normal rate and regular rhythm. Exam reveals no gallop and no friction rub.  No murmur heard. Pulmonary/Chest: Effort normal and breath sounds normal. No stridor. No respiratory distress. She has no wheezes. She has no rales. She exhibits tenderness.  Chest pain completely reproduced with palpation of the right costal cartilage along the sternal border.  There is no crepitance or deformity.  Abdominal: Soft. Bowel sounds are normal. There is no tenderness. There is no rebound and no guarding.  Musculoskeletal: Normal range of motion. She exhibits no edema or tenderness.  No lower extremity swelling, asymmetry or tenderness.  Lymphadenopathy:    She has no cervical adenopathy.  Neurological: She is alert and oriented to person, place, and time.  Moving all extremities without focal deficit.  Sensation intact.  Skin: Skin is warm and dry. Capillary refill takes less than 2 seconds. No rash noted. No erythema.  Psychiatric: She has a normal mood and affect. Her behavior is normal.  Nursing note and vitals reviewed.    ED Treatments / Results  Labs (all labs ordered are listed, but only abnormal results are displayed) Labs Reviewed  BASIC METABOLIC PANEL - Abnormal; Notable for the following components:      Result Value   Calcium 8.8 (*)    All other components within normal limits  CBC - Abnormal; Notable for the following components:   WBC 12.2 (*)    All other components within normal limits  TROPONIN I  PREGNANCY, URINE    EKG EKG Interpretation  Date/Time:  Tuesday August 31 2017 19:12:59  EDT Ventricular Rate:  63 PR Interval:  128 QRS Duration: 78 QT Interval:  388 QTC Calculation: 397 R Axis:   9 Text Interpretation:  Normal sinus rhythm Normal ECG Confirmed by Loren Racer ([id number]) on 08/31/2017 8:57:11 PM   Radiology Dg Chest 2 View  Result Date: 08/31/2017 CLINICAL DATA:  Right chest pain and shortness of breath for 5-6 days. EXAM: CHEST - 2 VIEW COMPARISON:  PA and lateral chest 07/06/2017 and 03/08/2013. FINDINGS: The lungs are clear. Heart size is normal. No pneumothorax or pleural effusion. No bony abnormality. IMPRESSION: Negative chest. Electronically Signed   By: Drusilla Kanner M.D.   On: 08/31/2017 20:23    Procedures Procedures (including  critical care time)  Medications Ordered in ED Medications  ibuprofen (ADVIL,MOTRIN) tablet 600 mg (has no administration in time range)     Initial Impression / Assessment and Plan / ED Course  I have reviewed the triage vital signs and the nursing notes.  Pertinent labs & imaging results that were available during my care of the patient were reviewed by me and considered in my medical decision making (see chart for details).     Patient recently seen for similar symptoms.  Had negative d-dimer at that time.  Low suspicion for PE.  Chest pain is reproduced with palpation over the costal margin.  Suspect costochondritis.  Will start on NSAIDs 3 times daily with meals.  Return precautions have been given.  Final Clinical Impressions(s) / ED Diagnoses   Final diagnoses:  Costochondritis    ED Discharge Orders        Ordered    ibuprofen (ADVIL,MOTRIN) 600 MG tablet  3 times daily after meals     08/31/17 2106       Loren Racer, MD 08/31/17 2115

## 2018-02-26 ENCOUNTER — Emergency Department (HOSPITAL_BASED_OUTPATIENT_CLINIC_OR_DEPARTMENT_OTHER)
Admission: EM | Admit: 2018-02-26 | Discharge: 2018-02-26 | Disposition: A | Discharge status: Home / Self Care | Payer: 59 | Attending: Emergency Medicine | Admitting: Emergency Medicine

## 2018-02-26 ENCOUNTER — Emergency Department (HOSPITAL_BASED_OUTPATIENT_CLINIC_OR_DEPARTMENT_OTHER): Payer: 59

## 2018-02-26 ENCOUNTER — Encounter (HOSPITAL_BASED_OUTPATIENT_CLINIC_OR_DEPARTMENT_OTHER): Payer: Self-pay | Admitting: Emergency Medicine

## 2018-02-26 ENCOUNTER — Other Ambulatory Visit: Payer: Self-pay

## 2018-02-26 DIAGNOSIS — R0789 Other chest pain: Secondary | ICD-10-CM | POA: Diagnosis present

## 2018-02-26 DIAGNOSIS — Z79899 Other long term (current) drug therapy: Secondary | ICD-10-CM | POA: Insufficient documentation

## 2018-02-26 DIAGNOSIS — J45909 Unspecified asthma, uncomplicated: Secondary | ICD-10-CM | POA: Insufficient documentation

## 2018-02-26 DIAGNOSIS — F17210 Nicotine dependence, cigarettes, uncomplicated: Secondary | ICD-10-CM | POA: Diagnosis not present

## 2018-02-26 LAB — BASIC METABOLIC PANEL
Anion gap: 6 (ref 5–15)
BUN: 9 mg/dL (ref 6–20)
CHLORIDE: 109 mmol/L (ref 98–111)
CO2: 23 mmol/L (ref 22–32)
Calcium: 8.7 mg/dL — ABNORMAL LOW (ref 8.9–10.3)
Creatinine, Ser: 0.84 mg/dL (ref 0.44–1.00)
GFR calc Af Amer: >60 mL/min (ref >60)
GFR calc non Af Amer: >60 mL/min (ref >60)
Glucose, Bld: 77 mg/dL (ref 70–99)
Potassium: 4.3 mmol/L (ref 3.5–5.1)
Sodium: 138 mmol/L (ref 135–145)

## 2018-02-26 LAB — CBC WITH DIFFERENTIAL/PLATELET
Abs Immature Granulocytes: 0.02 10*3/uL (ref 0.00–0.07)
BASOS ABS: 0 10*3/uL (ref 0.0–0.1)
Basophils Relative: 0 %
EOS PCT: 2 %
Eosinophils Absolute: 0.2 10*3/uL (ref 0.0–0.5)
HEMATOCRIT: 38.1 % (ref 36.0–46.0)
HEMOGLOBIN: 12.2 g/dL (ref 12.0–15.0)
IMMATURE GRANULOCYTES: 0 %
Lymphocytes Relative: 29 %
Lymphs Abs: 3 10*3/uL (ref 0.7–4.0)
MCH: 31.4 pg (ref 26.0–34.0)
MCHC: 32 g/dL (ref 30.0–36.0)
MCV: 98.2 fL (ref 80.0–100.0)
MONO ABS: 0.7 10*3/uL (ref 0.1–1.0)
MONOS PCT: 7 %
NEUTROS PCT: 62 %
Neutro Abs: 6.6 10*3/uL (ref 1.7–7.7)
Platelets: 304 10*3/uL (ref 150–400)
RBC: 3.88 MIL/uL (ref 3.87–5.11)
RDW: 12.4 % (ref 11.5–15.5)
WBC: 10.5 10*3/uL (ref 4.0–10.5)
nRBC: 0 % (ref 0.0–0.2)

## 2018-02-26 LAB — HCG, QUANTITATIVE, PREGNANCY: hCG, Beta Chain, Quant, S: <1 m[IU]/mL (ref <5)

## 2018-02-26 LAB — TROPONIN I: Troponin I: <0.03 ng/mL (ref <0.03)

## 2018-02-26 NOTE — Discharge Instructions (Addendum)
Please follow up with your primary care provider within 5-7 days for re-evaluation of your symptoms. If you do not have a primary care provider, information for a healthcare clinic has been provided for you to make arrangements for follow up care. Please return to the emergency department for any new or worsening symptoms. ° °

## 2018-02-26 NOTE — ED Triage Notes (Signed)
Pt here with chest pain and SOB x 1 week.  °

## 2018-02-26 NOTE — ED Notes (Signed)
Pt reports ShOB for a few days. 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 Pt denies any calf recent calf pain or swelling.

## 2018-02-26 NOTE — ED Provider Notes (Signed)
[facility] EMERGENCY DEPARTMENT Provider Note   CSN: [id number] Arrival date & time: 02/26/18  1816     History   Chief Complaint Chief Complaint  Patient presents with  . Shortness of Breath  . Chest Pain    HPI Hannah Morris is a 33 y.o. female.  HPI   Pt is a 33 y/o female with a h/o asthma (childhood) who presents to the ED today chest pain that began earlier this week. Chest pain located on the right side. Pt states pain is intermittent. Pain can happen at anytime and is not associated with exertion.  Pain rated at 7/10. States she feels like something is sitting on her chest. She has associated shortness of breath. Episodes last about 5-10 minutes. Pain resolves on its own. Pain also improves when she walks around or drinks water. Ibuprofen also improves sxs temporarily. She states that lifting her right breast up also alleviates her sxs. Reports soreness with palpation of the chest that reproduces her pain. No associated nausea, diaphoresis.  Pt states she has been having panic attacks ever since her chest pain began.  Patient becomes anxious and tearful during evaluation stating that she feels that she having a panic attack.  Denies URI sxs, fevers, chills, BLE swelling.   Denies leg pain/swelling, hemoptysis, recent surgery/trauma, recent long travel, hormone use, personal hx of cancer, or hx of DVT/PE. Denies h/o HTN, HLD, DM. She does smoke tobacco, smokes about 3 cigarettes today. She does not vape or smoke marijuana.    Past Medical History:  Diagnosis Date  . Asthma   . Gestational diabetes     Patient Active Problem List   Diagnosis Date Noted  . Foot pain, left 03/04/2016    Past Surgical History:  Procedure Laterality Date  . TONSILLECTOMY AND ADENOIDECTOMY       OB History    Gravida  4   Para  3   Term  3   Preterm      AB      Living  3     SAB      TAB      Ectopic      Multiple      Live Births                Home Medications    Prior to Admission medications   Medication Sig Start Date End Date Taking? Authorizing Provider  ibuprofen (ADVIL,MOTRIN) 600 MG tablet Take 1 tablet (600 mg total) by mouth 3 (three) times daily after meals. 08/31/17  Yes Loren Racer, MD  glyBURIDE (DIABETA) 2.5 MG tablet Take 2.5 mg by mouth daily with breakfast.    [provider]  HYDROcodone-ibuprofen (VICOPROFEN) 7.5-200 MG tablet Take 1 tablet by mouth every 6 (six) hours as needed for moderate pain. 03/04/16   Sheard, Myeong O, DPM  methocarbamol (ROBAXIN) 500 MG tablet Take 1 tablet (500 mg total) by mouth 2 (two) times daily. 07/06/17   Maxwell Caul, PA-C  metroNIDAZOLE (FLAGYL) 500 MG tablet Take 1 tablet (500 mg total) by mouth 2 (two) times daily. Patient not taking: Reported on 03/04/2016 09/28/14   Lavera Guise, MD  naproxen (NAPROSYN) 500 MG tablet Take 1 tablet (500 mg total) by mouth 2 (two) times daily. Patient not taking: Reported on 03/04/2016 01/23/16   Jaynie Crumble, PA-C  ondansetron (ZOFRAN) 4 MG tablet Take 1 tablet (4 mg total) by mouth every 6 (six) hours as needed for nausea or vomiting.  Patient not taking: Reported on 03/04/2016 09/28/14   Lavera Guise, MD  Prenatal Vit-Fe Fumarate-FA (PRENATAL VITAMIN PO) Take by mouth.    [provider]  sulfamethoxazole-trimethoprim (BACTRIM DS,SEPTRA DS) 800-160 MG per tablet Take 1 tablet by mouth 2 (two) times daily.    [provider]  traMADol (ULTRAM) 50 MG tablet Take 1 tablet (50 mg total) by mouth every 6 (six) hours as needed for moderate pain. Patient not taking: Reported on 03/04/2016 01/27/16   Fayrene Helper, PA-C    Family History History reviewed. No pertinent family history.  Social History Social History   Tobacco Use  . Smoking status: Current Every Day Smoker    Packs/day: 1.00    Years: 15.00    Pack years: 15.00    Types: Cigarettes  . Smokeless tobacco: Never Used  Substance Use Topics   . Alcohol use: Yes    Comment: occ  . Drug use: No     Allergies   Patient has no known allergies.   Review of Systems Review of Systems  Constitutional: Negative for chills and fever.  HENT: Negative for ear pain and sore throat.   Eyes: Negative for pain and visual disturbance.  Respiratory: Positive for shortness of breath. Negative for cough.   Cardiovascular: Positive for chest pain.  Gastrointestinal: Negative for abdominal pain, constipation, diarrhea, nausea and vomiting.  Genitourinary: Negative for dysuria and hematuria.  Musculoskeletal: Negative for back pain.  Skin: Negative for rash.  Neurological: Negative for headaches.  Psychiatric/Behavioral: The patient is nervous/anxious.   All other systems reviewed and are negative.    Physical Exam Updated Vital Signs BP (!) 142/98   Pulse 60   Temp 98.4 F (36.9 C) (Oral)   Resp 16   Ht 5\' 2"  (1.575 m)   Wt 121.6 kg   LMP 12/27/2017 (Approximate)   SpO2 100%   Breastfeeding No   BMI 49.02 kg/m   Physical Exam Vitals signs and nursing note reviewed.  Constitutional:      General: She is not in acute distress.    Appearance: She is well-developed. She is obese. She is not ill-appearing or toxic-appearing.  HENT:     Head: Normocephalic and atraumatic.  Eyes:     Conjunctiva/sclera: Conjunctivae normal.  Neck:     Musculoskeletal: Neck supple.  Cardiovascular:     Rate and Rhythm: Normal rate and regular rhythm.     Pulses: Normal pulses.     Heart sounds: No murmur.  Pulmonary:     Effort: Pulmonary effort is normal. No respiratory distress.     Breath sounds: Normal breath sounds. No decreased breath sounds, wheezing, rhonchi or rales.     Comments: Right-sided chest wall tenderness that reproduces symptoms. Abdominal:     Palpations: Abdomen is soft.     Tenderness: There is no abdominal tenderness.  Musculoskeletal:     Right lower leg: She exhibits no tenderness. No edema.     Left lower  leg: She exhibits no tenderness. No edema.  Skin:    General: Skin is warm and dry.  Neurological:     Mental Status: She is alert.      ED Treatments / Results  Labs (all labs ordered are listed, but only abnormal results are displayed) Labs Reviewed  BASIC METABOLIC PANEL - Abnormal; Notable for the following components:      Result Value   Calcium 8.7 (*)    All other components within normal limits  CBC WITH  DIFFERENTIAL/PLATELET  TROPONIN I  HCG, QUANTITATIVE, PREGNANCY    EKG None  Radiology Dg Chest 2 View  Result Date: 02/26/2018 CLINICAL DATA:  Short of breath and right-sided chest pain for 2-3 days. EXAM: CHEST - 2 VIEW COMPARISON:  09/15/2017 FINDINGS: Cardiac silhouette top-normal in size. No mediastinal or hilar masses. No evidence of adenopathy. Clear lungs.  No pleural effusion or pneumothorax. Skeletal structures are unremarkable. IMPRESSION: No active cardiopulmonary disease. Electronically Signed   By: Amie Portlandavid  Ormond M.D.   On: 02/26/2018 18:51    Procedures Procedures (including critical care time)  Medications Ordered in ED Medications - No data to display   Initial Impression / Assessment and Plan / ED Course  I have reviewed the triage vital signs and the nursing notes.  Pertinent labs & imaging results that were available during my care of the patient were reviewed by me and considered in my medical decision making (see chart for details).   Final Clinical Impressions(s) / ED Diagnoses   Final diagnoses:  Atypical chest pain   Patient is to be discharged with recommendation to follow up with PCP in regards to today's hospital visit. Chest pain is not likely of cardiac or pulmonary etiology d/t presentation, PERC negative, VSS, no tracheal deviation, no JVD or new murmur, RRR, breath sounds equal bilaterally, EKG without acute abnormalities, negative troponin, and negative CXR.   CBC without leukocytosis or anemia.  BMP with normal electrolytes  and kidney function.  Pregnancy test is negative.  Troponin is negative.  EKG with normal sinus rhythm.  Chest x-ray without pneumonia, pneumothorax or widened mediastinum.  Pain is reproducible on exam suspect musculoskeletal cause versus association with anxiety/panic attacks.  Less likely acute cardiac or pulmonary to pathology that would require admission or further work-up at this time.  Pt has been advised to return to the ED if CP becomes exertional, associated with diaphoresis or nausea, radiates to left jaw/arm, worsens or becomes concerning in any way. Pt appears reliable for follow up and is agreeable to discharge.   ED Discharge Orders    None       Rayne DuCouture, Nassir Neidert S, PA-C 02/26/18 2206    Arby BarrettePfeiffer, Marcy, MD 02/26/18 2321

## 2018-02-26 NOTE — ED Notes (Signed)
Pt and family understood dc material. NAD noted 

## 2018-12-26 ENCOUNTER — Emergency Department (HOSPITAL_BASED_OUTPATIENT_CLINIC_OR_DEPARTMENT_OTHER): Payer: 59

## 2018-12-26 ENCOUNTER — Emergency Department (HOSPITAL_BASED_OUTPATIENT_CLINIC_OR_DEPARTMENT_OTHER)
Admission: EM | Admit: 2018-12-26 | Discharge: 2018-12-26 | Disposition: A | Discharge status: Home / Self Care | Payer: 59 | Attending: Emergency Medicine | Admitting: Emergency Medicine

## 2018-12-26 ENCOUNTER — Other Ambulatory Visit: Payer: Self-pay

## 2018-12-26 ENCOUNTER — Encounter (HOSPITAL_BASED_OUTPATIENT_CLINIC_OR_DEPARTMENT_OTHER): Payer: Self-pay | Admitting: *Deleted

## 2018-12-26 DIAGNOSIS — S161XXA Strain of muscle, fascia and tendon at neck level, initial encounter: Secondary | ICD-10-CM | POA: Diagnosis not present

## 2018-12-26 DIAGNOSIS — S199XXA Unspecified injury of neck, initial encounter: Secondary | ICD-10-CM | POA: Diagnosis present

## 2018-12-26 DIAGNOSIS — J45909 Unspecified asthma, uncomplicated: Secondary | ICD-10-CM | POA: Diagnosis not present

## 2018-12-26 DIAGNOSIS — Z79899 Other long term (current) drug therapy: Secondary | ICD-10-CM | POA: Insufficient documentation

## 2018-12-26 DIAGNOSIS — F17210 Nicotine dependence, cigarettes, uncomplicated: Secondary | ICD-10-CM | POA: Diagnosis not present

## 2018-12-26 DIAGNOSIS — Y9389 Activity, other specified: Secondary | ICD-10-CM | POA: Insufficient documentation

## 2018-12-26 DIAGNOSIS — Y999 Unspecified external cause status: Secondary | ICD-10-CM | POA: Insufficient documentation

## 2018-12-26 DIAGNOSIS — Z7984 Long term (current) use of oral hypoglycemic drugs: Secondary | ICD-10-CM | POA: Diagnosis not present

## 2018-12-26 DIAGNOSIS — S0990XA Unspecified injury of head, initial encounter: Secondary | ICD-10-CM | POA: Diagnosis not present

## 2018-12-26 DIAGNOSIS — Y92410 Unspecified street and highway as the place of occurrence of the external cause: Secondary | ICD-10-CM | POA: Insufficient documentation

## 2018-12-26 MED ORDER — TRAMADOL HCL 50 MG PO TABS: 50.0000 mg | ORAL_TABLET | Freq: Four times a day (QID) | ORAL | 0 refills | Status: DC | PRN

## 2018-12-26 MED FILL — traMADol HCL 50 MG TABS: 50 | 3 days supply | Qty: 10 | Fill #0
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
## 2020-01-08 ENCOUNTER — Other Ambulatory Visit: Payer: Self-pay

## 2020-01-08 DIAGNOSIS — R059 Cough, unspecified: Secondary | ICD-10-CM | POA: Diagnosis present

## 2020-01-08 DIAGNOSIS — Z20822 Contact with and (suspected) exposure to covid-19: Secondary | ICD-10-CM | POA: Insufficient documentation

## 2020-01-08 DIAGNOSIS — B349 Viral infection, unspecified: Secondary | ICD-10-CM | POA: Insufficient documentation

## 2020-01-08 DIAGNOSIS — J3489 Other specified disorders of nose and nasal sinuses: Secondary | ICD-10-CM | POA: Insufficient documentation

## 2020-01-08 DIAGNOSIS — F17210 Nicotine dependence, cigarettes, uncomplicated: Secondary | ICD-10-CM | POA: Diagnosis not present

## 2020-01-08 DIAGNOSIS — J45909 Unspecified asthma, uncomplicated: Secondary | ICD-10-CM | POA: Diagnosis not present

## 2020-01-09 ENCOUNTER — Encounter (HOSPITAL_BASED_OUTPATIENT_CLINIC_OR_DEPARTMENT_OTHER): Payer: Self-pay | Admitting: *Deleted

## 2020-01-09 ENCOUNTER — Emergency Department (HOSPITAL_BASED_OUTPATIENT_CLINIC_OR_DEPARTMENT_OTHER)
Admission: EM | Admit: 2020-01-09 | Discharge: 2020-01-09 | Disposition: A | Discharge status: Home / Self Care | Payer: Medicaid Other | Attending: Emergency Medicine | Admitting: Emergency Medicine

## 2020-01-09 ENCOUNTER — Other Ambulatory Visit: Payer: Self-pay

## 2020-01-09 DIAGNOSIS — B349 Viral infection, unspecified: Secondary | ICD-10-CM

## 2020-01-09 LAB — RESPIRATORY PANEL BY RT PCR (FLU A&B, COVID)
Influenza A by PCR: NEGATIVE
Influenza B by PCR: NEGATIVE
SARS Coronavirus 2 by RT PCR: NEGATIVE

## 2020-01-09 NOTE — Discharge Instructions (Signed)
You may take over-the-counter medicine for symptomatic relief, such as Tylenol, Motrin, TheraFlu, Alka seltzer , black elderberry, etc. Please limit acetaminophen (Tylenol) to 4000 mg and Ibuprofen (Motrin, Advil, etc.) to 2400 mg for a 24hr period. Please note that other over-the-counter medicine may contain acetaminophen or ibuprofen as a component of their ingredients.   

## 2020-01-09 NOTE — ED Triage Notes (Signed)
Pt states nasal congestion, runny nose, diarrhea, SOB, cough, headache, chills for two days. Denies sick exposure.

## 2020-01-09 NOTE — ED Provider Notes (Signed)
[facility] EMERGENCY DEPARTMENT Provider Note  CSN: [id number] Arrival date & time: 01/08/20 2351  Chief Complaint(s) Nasal Congestion  HPI Hannah Morris is a 35 y.o. female   The history is provided by the patient.  Influenza Presenting symptoms: cough, diarrhea, fatigue, rhinorrhea and sore throat   Presenting symptoms: no vomiting   Severity:  Moderate Onset quality:  Gradual Progression:  Worsening Chronicity:  New Relieved by:  Nothing Associated symptoms: chills and nasal congestion   Associated symptoms: no decreased appetite, no decrease in physical activity, no ear pain, no mental status change and no neck stiffness   Risk factors: sick contacts (daughter was sick 2 days prior)     Past Medical History Past Medical History:  Diagnosis Date  . Asthma   . Gestational diabetes    Patient Active Problem List   Diagnosis Date Noted  . Foot pain, left 03/04/2016   Home Medication(s) Prior to Admission medications   Medication Sig Start Date End Date Taking? Authorizing Provider  glyBURIDE (DIABETA) 2.5 MG tablet Take 2.5 mg by mouth daily with breakfast.    [provider]  HYDROcodone-ibuprofen (VICOPROFEN) 7.5-200 MG tablet Take 1 tablet by mouth every 6 (six) hours as needed for moderate pain. 03/04/16   Sheard, Myeong O, DPM  ibuprofen (ADVIL,MOTRIN) 600 MG tablet Take 1 tablet (600 mg total) by mouth 3 (three) times daily after meals. 08/31/17   Loren Racer, MD  methocarbamol (ROBAXIN) 500 MG tablet Take 1 tablet (500 mg total) by mouth 2 (two) times daily. 07/06/17   Maxwell Caul, PA-C  metroNIDAZOLE (FLAGYL) 500 MG tablet Take 1 tablet (500 mg total) by mouth 2 (two) times daily. Patient not taking: Reported on 03/04/2016 09/28/14   Lavera Guise, MD  naproxen (NAPROSYN) 500 MG tablet Take 1 tablet (500 mg total) by mouth 2 (two) times daily. Patient not taking: Reported on 03/04/2016 01/23/16   Jaynie Crumble, PA-C  ondansetron  (ZOFRAN) 4 MG tablet Take 1 tablet (4 mg total) by mouth every 6 (six) hours as needed for nausea or vomiting. Patient not taking: Reported on 03/04/2016 09/28/14   Lavera Guise, MD  Prenatal Vit-Fe Fumarate-FA (PRENATAL VITAMIN PO) Take by mouth.    [provider]  sulfamethoxazole-trimethoprim (BACTRIM DS,SEPTRA DS) 800-160 MG per tablet Take 1 tablet by mouth 2 (two) times daily.    [provider]  traMADol (ULTRAM) 50 MG tablet Take 1 tablet (50 mg total) by mouth every 6 (six) hours as needed for moderate pain. Patient not taking: Reported on 03/04/2016 01/27/16   Fayrene Helper, PA-C  traMADol (ULTRAM) 50 MG tablet Take 1 tablet (50 mg total) by mouth every 6 (six) hours as needed. 12/26/18   Vanetta Mulders, MD  Past Surgical History Past Surgical History:  Procedure Laterality Date  . TONSILLECTOMY AND ADENOIDECTOMY     Family History No family history on file.  Social History Social History   Tobacco Use  . Smoking status: Current Every Day Smoker    Packs/day: 1.00    Years: 15.00    Pack years: 15.00    Types: Cigarettes  . Smokeless tobacco: Never Used  Vaping Use  . Vaping Use: Never used  Substance Use Topics  . Alcohol use: Yes    Comment: occ  . Drug use: No   Allergies Patient has no known allergies.  Review of Systems Review of Systems  Constitutional: Positive for chills and fatigue. Negative for decreased appetite.  HENT: Positive for congestion, rhinorrhea and sore throat. Negative for ear pain.   Respiratory: Positive for cough.   Gastrointestinal: Positive for diarrhea. Negative for vomiting.  Musculoskeletal: Negative for neck stiffness.   All other systems are reviewed and are negative for acute change except as noted in the HPI  Physical Exam Vital Signs  I have reviewed the triage vital signs BP (!)  171/89   Pulse (!) 57   Temp 98 F (36.7 C)   Resp 20   Ht 5\' 2"  (1.575 m)   Wt 132.5 kg   SpO2 99%   BMI 53.41 kg/m  All other systems are reviewed and are negative for acute change except as noted in the HPI  Physical Exam Vitals reviewed.  Constitutional:      General: She is not in acute distress.    Appearance: She is well-developed. She is not diaphoretic.  HENT:     Head: Normocephalic and atraumatic.     Right Ear: Tympanic membrane normal.     Left Ear: Tympanic membrane normal.     Nose: Mucosal edema, congestion and rhinorrhea present.     Right Sinus: No maxillary sinus tenderness or frontal sinus tenderness.     Left Sinus: No maxillary sinus tenderness or frontal sinus tenderness.     Mouth/Throat:     Lips: No lesions.     Mouth: No oral lesions.     Pharynx: No pharyngeal swelling, oropharyngeal exudate or posterior oropharyngeal erythema.     Tonsils: No tonsillar exudate or tonsillar abscesses.     Comments: Post nasal drip Eyes:     General: No scleral icterus.       Right eye: No discharge.        Left eye: No discharge.     Conjunctiva/sclera: Conjunctivae normal.     Pupils: Pupils are equal, round, and reactive to light.  Cardiovascular:     Rate and Rhythm: Normal rate and regular rhythm.     Heart sounds: No murmur heard.  No friction rub. No gallop.   Pulmonary:     Effort: Pulmonary effort is normal. No respiratory distress.     Breath sounds: Normal breath sounds. No stridor. No rales.  Abdominal:     General: There is no distension.     Palpations: Abdomen is soft.     Tenderness: There is no abdominal tenderness.  Musculoskeletal:        General: No tenderness.     Cervical back: Normal range of motion and neck supple.  Skin:    General: Skin is warm and dry.     Findings: No erythema or rash.  Neurological:     Mental Status: She is alert and oriented to person, place, and time.  ED Results and Treatments Labs (all labs  ordered are listed, but only abnormal results are displayed) Labs Reviewed  RESPIRATORY PANEL BY RT PCR (FLU A&B, COVID)                                                                                                                         EKG  EKG Interpretation  Date/Time:    Ventricular Rate:    PR Interval:    QRS Duration:   QT Interval:    QTC Calculation:   R Axis:     Text Interpretation:        Radiology No results found.  Pertinent labs & imaging results that were available during my care of the patient were reviewed by me and considered in my medical decision making (see chart for details).  Medications Ordered in ED Medications - No data to display                                                                                                                                  Procedures Procedures  (including critical care time)  Medical Decision Making / ED Course I have reviewed the nursing notes for this encounter and the patient's prior records (if available in EHR or on provided paperwork).   Hannah Morris was evaluated in Emergency Department on 01/09/2020 for the symptoms described in the history of present illness. She was evaluated in the context of the global COVID-19 pandemic, which necessitated consideration that the patient might be at risk for infection with the SARS-CoV-2 virus that causes COVID-19. Institutional protocols and algorithms that pertain to the evaluation of patients at risk for COVID-19 are in a state of rapid change based on information released by regulatory bodies including the CDC and federal and state organizations. These policies and algorithms were followed during the patient's care in the ED.  Patient presents with viral symptoms for 2 days. adequate oral hydration. Rest of history as above.  Patient appears well. No signs of toxicity, patient is interactive. No hypoxia, tachypnea or other signs of respiratory distress. No sign  of clinical dehydration. Lung exam clear. Rest of exam as above.  Most consistent with viral illness   No evidence suggestive of pharyngitis, AOM, PNA.  Chest x-ray not indicated at this time.  Discussed symptomatic treatment with the patient and they will follow closely with their PCP.  Final Clinical Impression(s) / ED Diagnoses Final diagnoses:  Viral infection    The patient appears reasonably screened and/or stabilized for discharge and I doubt any other medical condition or other [facility] requiring further screening, evaluation, or treatment in the ED at this time prior to discharge. Safe for discharge with strict return precautions.  Disposition: Discharge  Condition: Good  I have discussed the results, Dx and Tx plan with the patient/family who expressed understanding and agree(s) with the plan. Discharge instructions discussed at length. The patient/family was given strict return precautions who verbalized understanding of the instructions. No further questions at time of discharge.    ED Discharge Orders    None     Follow Up: Primary care provider  Schedule an appointment as soon as possible for a visit  If you do not have a primary care physician, contact [facility] at [phone] for referral       This chart was dictated using voice recognition software.  Despite best efforts to proofread,  errors can occur which can change the documentation meaning.   Nira Conn, MD 01/09/20 [phone]

## 2020-01-28 ENCOUNTER — Emergency Department (HOSPITAL_BASED_OUTPATIENT_CLINIC_OR_DEPARTMENT_OTHER): Payer: Medicaid Other

## 2020-01-28 ENCOUNTER — Other Ambulatory Visit: Payer: Self-pay

## 2020-01-28 ENCOUNTER — Emergency Department (HOSPITAL_BASED_OUTPATIENT_CLINIC_OR_DEPARTMENT_OTHER)
Admission: EM | Admit: 2020-01-28 | Discharge: 2020-01-29 | Disposition: A | Discharge status: Home / Self Care | Payer: Medicaid Other | Attending: Emergency Medicine | Admitting: Emergency Medicine

## 2020-01-28 ENCOUNTER — Encounter (HOSPITAL_BASED_OUTPATIENT_CLINIC_OR_DEPARTMENT_OTHER): Payer: Self-pay | Admitting: Emergency Medicine

## 2020-01-28 DIAGNOSIS — J45909 Unspecified asthma, uncomplicated: Secondary | ICD-10-CM | POA: Insufficient documentation

## 2020-01-28 DIAGNOSIS — M940 Chondrocostal junction syndrome [Tietze]: Secondary | ICD-10-CM | POA: Diagnosis not present

## 2020-01-28 DIAGNOSIS — R059 Cough, unspecified: Secondary | ICD-10-CM

## 2020-01-28 DIAGNOSIS — Z20822 Contact with and (suspected) exposure to covid-19: Secondary | ICD-10-CM | POA: Diagnosis not present

## 2020-01-28 DIAGNOSIS — R0602 Shortness of breath: Secondary | ICD-10-CM

## 2020-01-28 LAB — PREGNANCY, URINE: Preg Test, Ur: NEGATIVE

## 2020-01-28 LAB — BASIC METABOLIC PANEL
Anion gap: 8 (ref 5–15)
BUN: 10 mg/dL (ref 6–20)
CO2: 25 mmol/L (ref 22–32)
Calcium: 8.7 mg/dL — ABNORMAL LOW (ref 8.9–10.3)
Chloride: 103 mmol/L (ref 98–111)
Creatinine, Ser: 0.88 mg/dL (ref 0.44–1.00)
GFR, Estimated: >60 mL/min (ref >60)
Glucose, Bld: 78 mg/dL (ref 70–99)
Potassium: 3.7 mmol/L (ref 3.5–5.1)
Sodium: 136 mmol/L (ref 135–145)

## 2020-01-28 LAB — CBC
HCT: 39.2 % (ref 36.0–46.0)
Hemoglobin: 13.3 g/dL (ref 12.0–15.0)
MCH: 32 pg (ref 26.0–34.0)
MCHC: 33.9 g/dL (ref 30.0–36.0)
MCV: 94.2 fL (ref 80.0–100.0)
Platelets: 327 10*3/uL (ref 150–400)
RBC: 4.16 MIL/uL (ref 3.87–5.11)
RDW: 12 % (ref 11.5–15.5)
WBC: 14.5 10*3/uL — ABNORMAL HIGH (ref 4.0–10.5)
nRBC: 0 % (ref 0.0–0.2)

## 2020-01-28 LAB — RESP PANEL BY RT-PCR (FLU A&B, COVID) ARPGX2
Influenza A by PCR: NEGATIVE
Influenza B by PCR: NEGATIVE
SARS Coronavirus 2 by RT PCR: NEGATIVE

## 2020-01-28 LAB — TROPONIN I (HIGH SENSITIVITY): Troponin I (High Sensitivity): 3 ng/L (ref <18)

## 2020-01-28 NOTE — ED Provider Notes (Signed)
[facility] EMERGENCY DEPARTMENT Provider Note   CSN: [id number] Arrival date & time: 01/28/20  1743     History Chief Complaint  Patient presents with  . Shortness of Breath  . Chest Pain    Hannah Morris is a 34 y.o. female.  34 year old female with URI symptoms onset before thanksgiving, returned 1 week ago but worse. Reports SHOB with exertion and lying supine- currently sleeps on 2 pillows, denies lower extremity edema. Reports chest and throat thightness. Also headaches to left side of head- sensitive to touch which leads into panic attacks. Cough is infrequent but productive- thick white and occasionally yellow. Denies fevers, chills. Exposed to daughter who is also here sick, reports recurrent illnesses. Denies any past medical history.  Has not had COVID vaccines.         Past Medical History:  Diagnosis Date  . Asthma   . Gestational diabetes     Patient Active Problem List   Diagnosis Date Noted  . Foot pain, left 03/04/2016    Past Surgical History:  Procedure Laterality Date  . TONSILLECTOMY AND ADENOIDECTOMY       OB History    Gravida  4   Para  3   Term  3   Preterm      AB      Living  3     SAB      TAB      Ectopic      Multiple      Live Births              No family history on file.  Social History   Tobacco Use  . Smoking status: Current Every Day Smoker    Packs/day: 1.00    Years: 15.00    Pack years: 15.00    Types: Cigarettes  . Smokeless tobacco: Never Used  Vaping Use  . Vaping Use: Never used  Substance Use Topics  . Alcohol use: Yes    Comment: occ  . Drug use: No    Home Medications Prior to Admission medications   Medication Sig Start Date End Date Taking? Authorizing Provider  glyBURIDE (DIABETA) 2.5 MG tablet Take 2.5 mg by mouth daily with breakfast.    [provider]  ibuprofen (ADVIL) 600 MG tablet Take 1 tablet (600 mg total) by mouth 3 (three) times daily after  meals. 01/29/20   Jeannie Fend, PA-C  ondansetron (ZOFRAN) 4 MG tablet Take 1 tablet (4 mg total) by mouth every 6 (six) hours as needed for nausea or vomiting. Patient not taking: Reported on 03/04/2016 09/28/14   Lavera Guise, MD  Prenatal Vit-Fe Fumarate-FA (PRENATAL VITAMIN PO) Take by mouth.    [provider]    Allergies    Patient has no known allergies.  Review of Systems   Review of Systems  Constitutional: Positive for fatigue. Negative for chills and fever.  HENT: Positive for congestion.   Respiratory: Positive for cough and shortness of breath.   Cardiovascular: Positive for chest pain.  Gastrointestinal: Negative for diarrhea, nausea and vomiting.  Genitourinary: Negative for dysuria and frequency.  Musculoskeletal: Negative for arthralgias and myalgias.  Skin: Negative for rash.  Allergic/Immunologic: Negative for immunocompromised state.  Neurological: Positive for headaches. Negative for weakness.  All other systems reviewed and are negative.   Physical Exam Updated Vital Signs BP 140/80 (BP Location: Right Arm)   Pulse 60   Temp 98.6 F (37 C) (Oral)  Resp 17   Ht 5\' 2"  (1.575 m)   Wt 132.1 kg   SpO2 100%   BMI 53.26 kg/m   Physical Exam Vitals and nursing note reviewed.  Constitutional:      General: She is not in acute distress.    Appearance: She is well-developed. She is not diaphoretic.  HENT:     Head: Normocephalic and atraumatic.  Cardiovascular:     Rate and Rhythm: Normal rate and regular rhythm.  Pulmonary:     Effort: Pulmonary effort is normal.     Breath sounds: Normal breath sounds. No decreased breath sounds.  Chest:     Chest wall: Tenderness present.    Abdominal:     Palpations: Abdomen is soft.     Tenderness: There is no abdominal tenderness.  Musculoskeletal:     Right lower leg: No edema.     Left lower leg: No edema.  Skin:    General: Skin is warm and dry.     Findings: No erythema or rash.    Neurological:     Mental Status: She is alert and oriented to person, place, and time.  Psychiatric:        Behavior: Behavior normal.     ED Results / Procedures / Treatments   Labs (all labs ordered are listed, but only abnormal results are displayed) Labs Reviewed  BASIC METABOLIC PANEL - Abnormal; Notable for the following components:      Result Value   Calcium 8.7 (*)    All other components within normal limits  CBC - Abnormal; Notable for the following components:   WBC 14.5 (*)    All other components within normal limits  RESP PANEL BY RT-PCR (FLU A&B, COVID) ARPGX2  PREGNANCY, URINE  BRAIN NATRIURETIC PEPTIDE  TROPONIN I (HIGH SENSITIVITY)    EKG EKG Interpretation  Date/Time:  Sunday January 28 2020 18:01:42 EST Ventricular Rate:  56 PR Interval:  134 QRS Duration: 78 QT Interval:  396 QTC Calculation: 382 R Axis:   94 Text Interpretation: Sinus bradycardia Rightward axis Borderline ECG No acute changes Confirmed by 12-28-1981 Derwood Kaplan) on 01/28/2020 11:42:35 PM   Radiology DG Chest 2 View  Result Date: 01/28/2020 CLINICAL DATA:  Chest pain and short of breath rule 1 week EXAM: CHEST - 2 VIEW COMPARISON:  01/28/2020 at 8:02 p.m. FINDINGS: Frontal and lateral views of the chest demonstrate an unremarkable cardiac silhouette. No airspace disease, effusion, or pneumothorax. No acute bony abnormalities. IMPRESSION: 1. No acute intrathoracic process. Electronically Signed   By: 14/06/2019 M.D.   On: 01/28/2020 23:17   DG Chest Port 1 View  Result Date: 01/28/2020 CLINICAL DATA:  Shortness of breath and chest pain for 1 week EXAM: PORTABLE CHEST 1 VIEW COMPARISON:  Radiograph 02/26/2018 FINDINGS: Shallow inspiration and body habitus likely contribute to increased attenuation towards the lung bases, more hazy interstitial opacity, cuffing and vascular congestion suggest pulmonary edematous changes as well. Lobular configuration of the cardiac silhouette could  reflect frank cardiac enlargement and/or pericardial effusion. No pneumothorax or visible pleural effusion is seen. No acute osseous or soft tissue abnormality. IMPRESSION: 1. Features suggest pulmonary edema on a background of low volumes and atelectasis. 2. Lobular configuration of the cardiac silhouette could reflect frank cardiac enlargement and/or pericardial effusion. Electronically Signed   By: 04/27/2018 M.D.   On: 01/28/2020 20:29    Procedures Procedures (including critical care time)  Medications Ordered in ED Medications - No data to display  ED Course  I have reviewed the triage vital signs and the nursing notes.  Pertinent labs & imaging results that were available during my care of the patient were reviewed by me and considered in my medical decision making (see chart for details).  Clinical Course as of Jan 29 39  Mon Jan 29, 2020  8190 34 year old female with no significant past medical history presents with complaint of shortness of breath, orthopnea, dyspnea on exertion after URI 2 weeks ago.  Denies fevers or chills, states that she has a cough that is occasionally productive with white or yellow sputum.  Patient is here with her daughter who is also sick with similar symptoms.  Patient has not had her Covid vaccines.  Patient is a daily smoker, reports asthma as a child, no other lung problems.  On exam, patient is found to have chest wall tenderness, is well-appearing in no acute distress, no lower extremity edema and does not become short of breath when placed in supine position. Review of labs, patient is found a mild leukocytosis with white count of 14.5, BMP is unremarkable, troponin is 3, EKG without acute ischemic changes.  Covid and flu tests are negative.  Initial chest x-ray with concern for cardiomegaly, this was a portable study and patient was sent back for two-view studies to further evaluate, repeat chest x-ray is unremarkable. BNP was ordered for patient's  complaint of orthopnea and DOE, is pending. SPECT costochondritis or viral URI, plan is to discharge BNP remains unremarkable. Review of vitals, blood pressure initially elevated, has improved to 140/80.   [LM]    Clinical Course User Index [LM] Alden Hipp   MDM Rules/Calculators/A&P                          Final Clinical Impression(s) / ED Diagnoses Final diagnoses:  Costochondritis, acute  Shortness of breath    Rx / DC Orders ED Discharge Orders         Ordered    ibuprofen (ADVIL) 600 MG tablet  3 times daily after meals        01/29/20 0039           Jeannie Fend, PA-C 01/29/20 0040    Derwood Kaplan, MD 01/29/20 1708

## 2020-01-28 NOTE — ED Triage Notes (Signed)
Reports SOB with chest pain for the last week or so.  Endorses a productive cough with thick white sputum.  Daughter also being seen today.  Hx of asthma.  No issues since a child.  Breathing easy and unlabored in triage.

## 2020-01-29 LAB — BRAIN NATRIURETIC PEPTIDE: B Natriuretic Peptide: 62.7 pg/mL (ref 0.0–100.0)

## 2020-01-29 MED ORDER — IBUPROFEN 600 MG PO TABS: 600.0000 mg | ORAL_TABLET | Freq: Three times a day (TID) | ORAL | 0 refills | Status: DC

## 2020-01-29 NOTE — Discharge Instructions (Signed)
Chest x-ray today is normal, your labs and EKG are reassuring. Suspect viral costochondritis following your cold.  Recommend recheck with your doctor, take ibuprofen.

## 2020-11-07 ENCOUNTER — Other Ambulatory Visit: Payer: Self-pay

## 2020-11-07 ENCOUNTER — Emergency Department (HOSPITAL_BASED_OUTPATIENT_CLINIC_OR_DEPARTMENT_OTHER)
Admission: EM | Admit: 2020-11-07 | Discharge: 2020-11-07 | Disposition: A | Discharge status: Home / Self Care | Payer: No Typology Code available for payment source | Attending: Emergency Medicine | Admitting: Emergency Medicine

## 2020-11-07 ENCOUNTER — Encounter (HOSPITAL_BASED_OUTPATIENT_CLINIC_OR_DEPARTMENT_OTHER): Payer: Self-pay | Admitting: *Deleted

## 2020-11-07 DIAGNOSIS — J45909 Unspecified asthma, uncomplicated: Secondary | ICD-10-CM | POA: Insufficient documentation

## 2020-11-07 DIAGNOSIS — R002 Palpitations: Secondary | ICD-10-CM | POA: Insufficient documentation

## 2020-11-07 DIAGNOSIS — Z87891 Personal history of nicotine dependence: Secondary | ICD-10-CM | POA: Insufficient documentation

## 2020-11-07 DIAGNOSIS — R202 Paresthesia of skin: Secondary | ICD-10-CM | POA: Insufficient documentation

## 2020-11-07 DIAGNOSIS — N3090 Cystitis, unspecified without hematuria: Secondary | ICD-10-CM | POA: Insufficient documentation

## 2020-11-07 LAB — CBC WITH DIFFERENTIAL/PLATELET
Abs Immature Granulocytes: 0.03 10*3/uL (ref 0.00–0.07)
Basophils Absolute: 0 10*3/uL (ref 0.0–0.1)
Basophils Relative: 0 %
Eosinophils Absolute: 0.1 10*3/uL (ref 0.0–0.5)
Eosinophils Relative: 1 %
HCT: 40.7 % (ref 36.0–46.0)
Hemoglobin: 13.9 g/dL (ref 12.0–15.0)
Immature Granulocytes: 0 %
Lymphocytes Relative: 25 %
Lymphs Abs: 2.7 10*3/uL (ref 0.7–4.0)
MCH: 32.2 pg (ref 26.0–34.0)
MCHC: 34.2 g/dL (ref 30.0–36.0)
MCV: 94.2 fL (ref 80.0–100.0)
Monocytes Absolute: 0.6 10*3/uL (ref 0.1–1.0)
Monocytes Relative: 6 %
Neutro Abs: 7.4 10*3/uL (ref 1.7–7.7)
Neutrophils Relative %: 68 %
Platelets: 311 10*3/uL (ref 150–400)
RBC: 4.32 MIL/uL (ref 3.87–5.11)
RDW: 12.7 % (ref 11.5–15.5)
WBC: 11 10*3/uL — ABNORMAL HIGH (ref 4.0–10.5)
nRBC: 0 % (ref 0.0–0.2)

## 2020-11-07 LAB — COMPREHENSIVE METABOLIC PANEL
ALT: 16 U/L (ref 0–44)
AST: 15 U/L (ref 15–41)
Albumin: 4.4 g/dL (ref 3.5–5.0)
Alkaline Phosphatase: 52 U/L (ref 38–126)
Anion gap: 6 (ref 5–15)
BUN: 9 mg/dL (ref 6–20)
CO2: 26 mmol/L (ref 22–32)
Calcium: 8.8 mg/dL — ABNORMAL LOW (ref 8.9–10.3)
Chloride: 106 mmol/L (ref 98–111)
Creatinine, Ser: 0.9 mg/dL (ref 0.44–1.00)
GFR, Estimated: >60 mL/min (ref >60)
Glucose, Bld: 72 mg/dL (ref 70–99)
Potassium: 3.9 mmol/L (ref 3.5–5.1)
Sodium: 138 mmol/L (ref 135–145)
Total Bilirubin: 0.4 mg/dL (ref 0.3–1.2)
Total Protein: 8 g/dL (ref 6.5–8.1)

## 2020-11-07 LAB — URINALYSIS, ROUTINE W REFLEX MICROSCOPIC
Bilirubin Urine: NEGATIVE
Glucose, UA: NEGATIVE mg/dL
Ketones, ur: NEGATIVE mg/dL
Nitrite: NEGATIVE
Protein, ur: NEGATIVE mg/dL
Specific Gravity, Urine: 1.025 (ref 1.005–1.030)
pH: 7 (ref 5.0–8.0)

## 2020-11-07 LAB — URINALYSIS, MICROSCOPIC (REFLEX): WBC, UA: >50 WBC/hpf (ref 0–5)

## 2020-11-07 LAB — PREGNANCY, URINE: Preg Test, Ur: NEGATIVE

## 2020-11-07 MED ORDER — NITROFURANTOIN MONOHYD MACRO 100 MG PO CAPS: 100.0000 mg | ORAL_CAPSULE | Freq: Two times a day (BID) | ORAL | 0 refills | Status: DC

## 2020-11-07 NOTE — ED Notes (Signed)
Per Pt. Her feet have been going numb since Sat.  Pt. Is able to walk normal and no distress noted.  Pt. Also reports pain in the L knee.  Pt. Reports she has numbness in the buttock region and also numbness in her vagina area.

## 2020-11-07 NOTE — ED Triage Notes (Signed)
Numbness from her waist, vagina, buttocks and her feet x 3 days. She is ambulatory.
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
## 2020-11-07 NOTE — Discharge Instructions (Addendum)
Please follow up with your PCP for re-evaluation of paresthesia symptoms.   Macrobid is given to treat your UTI. Please finish the entire course.   Please return to the ER for any new or worsening symptoms as discussed.
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
## 2021-08-04 ENCOUNTER — Encounter (HOSPITAL_BASED_OUTPATIENT_CLINIC_OR_DEPARTMENT_OTHER): Payer: Self-pay | Admitting: Emergency Medicine

## 2021-08-04 ENCOUNTER — Other Ambulatory Visit: Payer: Self-pay

## 2021-08-04 ENCOUNTER — Emergency Department (HOSPITAL_BASED_OUTPATIENT_CLINIC_OR_DEPARTMENT_OTHER)
Admission: EM | Admit: 2021-08-04 | Discharge: 2021-08-04 | Disposition: A | Discharge status: Home / Self Care | Payer: No Typology Code available for payment source | Attending: Emergency Medicine | Admitting: Emergency Medicine

## 2021-08-04 DIAGNOSIS — R519 Headache, unspecified: Secondary | ICD-10-CM | POA: Diagnosis not present

## 2021-08-04 DIAGNOSIS — H538 Other visual disturbances: Secondary | ICD-10-CM | POA: Insufficient documentation

## 2021-08-04 DIAGNOSIS — H5711 Ocular pain, right eye: Secondary | ICD-10-CM | POA: Insufficient documentation

## 2021-08-04 MED ORDER — AMOXICILLIN-POT CLAVULANATE 875-125 MG PO TABS: 1.0000 | ORAL_TABLET | Freq: Two times a day (BID) | ORAL | 0 refills | Status: DC

## 2021-08-04 MED ORDER — TETRACAINE HCL 0.5 % OP SOLN
2.0000 [drp] | Freq: Once | OPHTHALMIC | Status: AC
  Administered 2021-08-04: 2 [drp] via OPHTHALMIC
  Filled 2021-08-04: qty 4

## 2021-08-04 MED ORDER — FLUORESCEIN SODIUM 1 MG OP STRP
1.0000 | ORAL_STRIP | Freq: Once | OPHTHALMIC | Status: AC
  Administered 2021-08-04: 1 via OPHTHALMIC
  Filled 2021-08-04: qty 1

## 2021-08-04 MED ORDER — PROCHLORPERAZINE EDISYLATE 10 MG/2ML IJ SOLN
10.0000 mg | Freq: Once | INTRAMUSCULAR | Status: AC
  Administered 2021-08-04: 10 mg via INTRAMUSCULAR
  Filled 2021-08-04: qty 2

## 2021-08-04 MED ORDER — DEXAMETHASONE 4 MG PO TABS
10.0000 mg | ORAL_TABLET | Freq: Once | ORAL | Status: AC
  Administered 2021-08-04: 10 mg via ORAL
  Filled 2021-08-04: qty 3

## 2021-08-04 MED ORDER — DIPHENHYDRAMINE HCL 50 MG/ML IJ SOLN
25.0000 mg | Freq: Once | INTRAMUSCULAR | Status: AC
  Administered 2021-08-04: 25 mg via INTRAMUSCULAR
  Filled 2021-08-04: qty 1

## 2021-08-04 NOTE — ED Triage Notes (Signed)
Patient reports headache, blurred vision, and dizziness since yesterday morning. Reports symptoms have been persistent, c/o numbness to right arm for the last hour.

## 2021-08-04 NOTE — ED Provider Notes (Signed)
[facility] EMERGENCY DEPARTMENT Provider Note   CSN: [id number] Arrival date & time: 08/04/21  1944     History  Chief Complaint  Patient presents with   Headache    Hannah Morris is a 36 y.o. female.  36 yo F with a chief complaints of a right-sided headache and blurry vision to the right eye.  The headaches been going on for about a week.  Slow in onset seems to come and go.  Seems improved with ibuprofen.  Noticed that she started having some issues with the right eye vision over the past couple days feels like when she squints is what it looks like on a blurry and squinted.  She denies any trauma to the eye.  Has no trouble with her vision when she closes the right eye.  Denies fevers denies cough or congestion.  Denies history of headaches similar.   Headache      Home Medications Prior to Admission medications   Medication Sig Start Date End Date Taking? Authorizing Provider  amoxicillin-clavulanate (AUGMENTIN) 875-125 MG tablet Take 1 tablet by mouth every 12 (twelve) hours. 08/04/21  Yes Melene Plan, DO  glyBURIDE (DIABETA) 2.5 MG tablet Take 2.5 mg by mouth daily with breakfast.    [provider]  ibuprofen (ADVIL) 600 MG tablet Take 1 tablet (600 mg total) by mouth 3 (three) times daily after meals. 01/29/20   Jeannie Fend, PA-C  nitrofurantoin, macrocrystal-monohydrate, (MACROBID) 100 MG capsule Take 1 capsule (100 mg total) by mouth 2 (two) times daily. 11/07/20   Achille Rich, PA-C  ondansetron (ZOFRAN) 4 MG tablet Take 1 tablet (4 mg total) by mouth every 6 (six) hours as needed for nausea or vomiting. Patient not taking: No sig reported 09/28/14   Lavera Guise, MD  Prenatal Vit-Fe Fumarate-FA (PRENATAL VITAMIN PO) Take by mouth.    [provider]      Allergies    Patient has no known allergies.    Review of Systems   Review of Systems  Neurological:  Positive for headaches.    Physical Exam Updated Vital Signs BP (!) 151/80    Pulse (!) 55   Temp 98.4 F (36.9 C) (Oral)   Resp 18   Ht 5\' 2"  (1.575 m)   Wt 119.5 kg   SpO2 100%   BMI 48.18 kg/m  Physical Exam Vitals and nursing note reviewed.  Constitutional:      General: She is not in acute distress.    Appearance: She is well-developed. She is not diaphoretic.  HENT:     Head: Normocephalic and atraumatic.  Eyes:     General: Lids are normal. Lids are everted, no foreign bodies appreciated.     Intraocular pressure: Right eye pressure is 18 mmHg. Measurements were taken using a handheld tonometer.    Extraocular Movements: Extraocular movements intact.     Conjunctiva/sclera: Conjunctivae normal.     Pupils: Pupils are equal, round, and reactive to light.  Cardiovascular:     Rate and Rhythm: Normal rate and regular rhythm.     Heart sounds: No murmur heard.    No friction rub. No gallop.  Pulmonary:     Effort: Pulmonary effort is normal.     Breath sounds: No wheezing or rales.  Abdominal:     General: There is no distension.     Palpations: Abdomen is soft.     Tenderness: There is no abdominal tenderness.  Musculoskeletal:  General: No tenderness.     Cervical back: Normal range of motion and neck supple.  Skin:    General: Skin is warm and dry.  Neurological:     Mental Status: She is alert and oriented to person, place, and time.     Cranial Nerves: Cranial nerves 2-12 are intact.     Sensory: Sensation is intact.     Motor: Motor function is intact.     Coordination: Coordination is intact.     Gait: Gait is intact.     Comments: Benign neurologic exam  Psychiatric:        Behavior: Behavior normal.     ED Results / Procedures / Treatments   Labs (all labs ordered are listed, but only abnormal results are displayed) Labs Reviewed - No data to display  EKG EKG Interpretation  Date/Time:  Monday August 04 2021 22:35:39 EDT Ventricular Rate:  47 PR Interval:  136 QRS Duration: 83 QT Interval:  423 QTC  Calculation: 374 R Axis:   58 Text Interpretation: Sinus bradycardia No significant change since last tracing Confirmed by Melene Plan [phone]) on 08/04/2021 10:49:48 PM  Radiology No results found.  Procedures Procedures    Medications Ordered in ED Medications  prochlorperazine (COMPAZINE) injection 10 mg (has no administration in time range)  diphenhydrAMINE (BENADRYL) injection 25 mg (has no administration in time range)  dexamethasone (DECADRON) tablet 10 mg (has no administration in time range)  tetracaine (PONTOCAINE) 0.5 % ophthalmic solution 2 drop (2 drops Right Eye Given 08/04/21 2253)  fluorescein ophthalmic strip 1 strip (1 strip Right Eye Given 08/04/21 2253)    ED Course/ Medical Decision Making/ A&P                           Medical Decision Making Risk Prescription drug management.   36 yo F with a chief complaints of a right-sided headache right eye pain and change in vision to the right eye.  She has been having headaches for about a week and started noticing her vision had changed over the past 48 hours.  No obvious gross abnormality on external inspection of the eye.  Will check the pressure fluorescein exam.  Ophthalmology follow-up.  Fluorescein exam without uptake.  Intraocular pressure 18.  Patient was feeling a bit better from the eye pain after tetracaine administration but was complaining a little bit more of pain along the frontal sinus.  She does have some tenderness on percussion and some signs of congestion clinically.  I will start her on Augmentin.  We will give a headache cocktail here as well.  Give neurology follow-up.  Unlikely to be a stroke with monocular symptoms.  Not sure how the sinus symptoms would be associated with vision changes, she is not clinically unwell and so I think a brain abscess would be unlikely.  No fevers.  We will hold off on advanced imaging at this time as she has a normal neurologic exam and monocular symptoms.  11:11  PM:  I have discussed the diagnosis/risks/treatment options with the patient and family.  Evaluation and diagnostic testing in the emergency department does not suggest an emergent condition requiring admission or immediate intervention beyond what has been performed at this time.  They will follow up with  PCP, neuro, optho. We also discussed returning to the ED immediately if new or worsening sx occur. We discussed the sx which are most concerning (e.g., sudden worsening pain, fever, inability  to tolerate by mouth) that necessitate immediate return. Medications administered to the patient during their visit and any new prescriptions provided to the patient are listed below.  Medications given during this visit Medications  prochlorperazine (COMPAZINE) injection 10 mg (has no administration in time range)  diphenhydrAMINE (BENADRYL) injection 25 mg (has no administration in time range)  dexamethasone (DECADRON) tablet 10 mg (has no administration in time range)  tetracaine (PONTOCAINE) 0.5 % ophthalmic solution 2 drop (2 drops Right Eye Given 08/04/21 2253)  fluorescein ophthalmic strip 1 strip (1 strip Right Eye Given 08/04/21 2253)     The patient appears reasonably screen and/or stabilized for discharge and I doubt any other medical condition or other [facility] requiring further screening, evaluation, or treatment in the ED at this time prior to discharge.          Final Clinical Impression(s) / ED Diagnoses Final diagnoses:  Acute right eye pain    Rx / DC Orders ED Discharge Orders          Ordered    Ambulatory referral to Neurology       Comments: Complicated migraine?   08/04/21 2307    amoxicillin-clavulanate (AUGMENTIN) 875-125 MG tablet  Every 12 hours        08/04/21 2308              Melene Plan, DO 08/04/21 2311

## 2021-08-04 NOTE — Discharge Instructions (Signed)
Please call the ophthalmologist first thing in the morning to try and set up an appointment to be seen in the office to have the eye evaluated closer.  I have started you on antibiotics to treat you for possible sinus infection.  I have placed an order to have the neurologist call you to see you in the office.  Please return for sudden worsening headache one-sided numbness or weakness or difficulty with speech or swallowing.

## 2021-08-06 ENCOUNTER — Emergency Department (HOSPITAL_COMMUNITY)
Admission: EM | Admit: 2021-08-06 | Discharge: 2021-08-06 | Discharge status: Against Medical Advice | Payer: No Typology Code available for payment source | Attending: Emergency Medicine | Admitting: Emergency Medicine

## 2021-08-06 ENCOUNTER — Encounter (HOSPITAL_COMMUNITY): Payer: Self-pay

## 2021-08-06 ENCOUNTER — Other Ambulatory Visit: Payer: Self-pay

## 2021-08-06 DIAGNOSIS — H538 Other visual disturbances: Secondary | ICD-10-CM | POA: Diagnosis present

## 2021-08-06 DIAGNOSIS — Z5321 Procedure and treatment not carried out due to patient leaving prior to being seen by health care provider: Secondary | ICD-10-CM | POA: Insufficient documentation

## 2021-08-06 DIAGNOSIS — G43909 Migraine, unspecified, not intractable, without status migrainosus: Secondary | ICD-10-CM | POA: Diagnosis not present

## 2021-08-06 NOTE — ED Provider Triage Note (Signed)
Emergency Medicine Provider Triage Evaluation Note  Hannah Morris , a 36 y.o. female  was evaluated in triage.  Pt complains of migraine and eye pain. Was seen on 6/12 for similar symptoms, was discharged with recommendation to follow up with neurologist and eye doctor. Eye doctor evaluated today and referred her back to ER for MRI w wo contrast with concern for optic neuritis.   Review of Systems  Positive: Headache, blurry vision, eye pain Negative: Loss of vision, fever  Physical Exam  BP (!) 159/80 (BP Location: Right Arm)   Pulse (!) 54   Temp 97.9 F (36.6 C) (Oral)   Resp 14   Ht 5\' 2"  (1.575 m)   Wt 118.8 kg   SpO2 99%   BMI 47.92 kg/m  Gen:   Awake, no distress   Resp:  Normal effort  MSK:   Moves extremities without difficulty  Other:    Medical Decision Making  Medically screening exam initiated at 11:25 AM.  Appropriate orders placed.  Hannah Morris was informed that the remainder of the evaluation will be completed by another provider, this initial triage assessment does not replace that evaluation, and the importance of remaining in the ED until their evaluation is complete.     Hannah Morris T, PA-C 08/06/21 1126

## 2021-08-06 NOTE — ED Notes (Signed)
Patient left without being seen.

## 2021-08-06 NOTE — ED Notes (Signed)
NA for bed assignment.  

## 2021-08-06 NOTE — ED Triage Notes (Signed)
Pt arrived POV from the eye doctor c/o migraines and blurred vision. Per the eye doctor they want her to have an MRI done.

## 2021-08-12 ENCOUNTER — Encounter: Payer: Self-pay | Admitting: Neurology

## 2021-08-12 ENCOUNTER — Telehealth: Payer: Self-pay | Admitting: Neurology

## 2021-08-12 ENCOUNTER — Telehealth: Payer: Self-pay | Admitting: *Deleted

## 2021-08-12 ENCOUNTER — Ambulatory Visit (INDEPENDENT_AMBULATORY_CARE_PROVIDER_SITE_OTHER): Payer: No Typology Code available for payment source | Admitting: Neurology

## 2021-08-12 ENCOUNTER — Ambulatory Visit: Payer: No Typology Code available for payment source | Admitting: Neurology

## 2021-08-12 VITALS — BP 178/88 | HR 64 | Ht 62.0 in | Wt 260.0 lb

## 2021-08-12 DIAGNOSIS — R269 Unspecified abnormalities of gait and mobility: Secondary | ICD-10-CM

## 2021-08-12 DIAGNOSIS — R200 Anesthesia of skin: Secondary | ICD-10-CM

## 2021-08-12 DIAGNOSIS — H469 Unspecified optic neuritis: Secondary | ICD-10-CM | POA: Diagnosis not present

## 2021-08-12 DIAGNOSIS — Z79899 Other long term (current) drug therapy: Secondary | ICD-10-CM

## 2021-08-12 NOTE — Telephone Encounter (Signed)
[employer]/medicaid sent to GI they obtain auth

## 2021-08-12 NOTE — Progress Notes (Signed)
[facility]  PATIENT: Hannah Morris DOB: November 16, 1985  REFERRING DOCTOR OR PCP: Melene Plan, DO (referral); [facility] (PCP) SOURCE: Patient, notes from emergency room, lab results, imaging results, CT scan from 2020 personally reviewed.  _________________________________   HISTORICAL  CHIEF COMPLAINT:  Chief Complaint  Patient presents with   New Patient (Initial Visit)    Pt alone, rm 1. She states initially she had migraine and then she began to notice concerns of blurry vision this started around first of June. She then began to loose most of her vision on the right side which triggered her to go to ER. She saw a eye MD last week who states that pressure in the eye was normal. She has not had imaging of the brain completed. She c/o of N&T sensation in feet and into the pelvic area. Lays down at night feet and legs are in pain    HISTORY OF PRESENT ILLNESS:  I had the pleasure of seeing your patient, Hannah Morris, at [facility] for neurologic consultation regarding her headache and right visual symptoms.   She is a 36 year old woman with right eye pain/headace.  Pain was worse with movements during the first few days.   Advil helped slightly.   Compared to last week, pain is better but not resolved.     The pain was centered in and around the right eye.  Visual loss on the right started a day or two after the eye pain started.   Initially, visual acuity was reduced with a gray veil but over 2-3 days, vision was nearly completely lost.    Currently, she can only see hand waving and can't count fingers OD.      Vision appears dark.  She has numbness in her feet that is up to her knee and is more intense when she lays down.   Two days ago, she had the onset of numbness in her groin that has persisted.     She notes that she veers to the left over the last week when she walks.   She has needed to hold the bannister x 1 week.     She has  noted mild urinary frequency but no incontinence.      She denies much fatigue.    She sleeps poorly but this is chronic,    She notes no difficulty with cognition or dperession.   She has had some anxiety with current health issues.      She is otherwise in good health.   Vit D was low and she takes a supplement x 1 month.   She had gestational diabetes x 4 pregnancies.      No FH of MS.   Imaging: CT scan of the head 01/25/2019 was normal.  CT scan of the cervical spine 01/25/2019 showed straightening of the cervical curvature and mild degenerative change at C5-C6 and C6-C7.  REVIEW OF SYSTEMS: Constitutional: No fevers, chills, sweats, or change in appetite Eyes: As above. Ear, nose and throat: No hearing loss, ear pain, nasal congestion, sore throat Cardiovascular: No chest pain, palpitations Respiratory:  No shortness of breath at rest or with exertion.   No wheezes GastrointestinaI: No nausea, vomiting, diarrhea, abdominal pain, fecal incontinence Genitourinary:  No dysuria, urinary retention or frequency.  No nocturia. Musculoskeletal:  No neck pain, back pain Integumentary: No rash, pruritus, skin lesions Neurological: as above Psychiatric: No depression at this time.  No anxiety Endocrine: No palpitations, diaphoresis,  change in appetite, change in weigh or increased thirst.  Gestational diabetes. Hematologic/Lymphatic:  No anemia, purpura, petechiae. Allergic/Immunologic: No itchy/runny eyes, nasal congestion, recent allergic reactions, rashes  ALLERGIES: No Known Allergies  HOME MEDICATIONS:  Current Outpatient Medications:    ibuprofen (ADVIL) 200 MG tablet, Take 200 mg by mouth 3 (three) times daily as needed for headache., Disp: , Rfl:    Vitamin D, Ergocalciferol, (DRISDOL) 1.25 MG (50000 UNIT) CAPS capsule, Take 50,000 Units by mouth every Monday., Disp: , Rfl:   PAST MEDICAL HISTORY: Past Medical History:  Diagnosis Date   Asthma    Gestational diabetes      PAST SURGICAL HISTORY: Past Surgical History:  Procedure Laterality Date   TONSILLECTOMY AND ADENOIDECTOMY      FAMILY HISTORY: Family History  Problem Relation Age of Onset   Hypertension Mother    Hypertension Father     SOCIAL HISTORY:  Social History   Socioeconomic History   Marital status: Married    Spouse name: Not on file   Number of children: Not on file   Years of education: Not on file   Highest education level: Not on file  Occupational History   Not on file  Tobacco Use   Smoking status: Every Day    Packs/day: 0.25    Years: 15.00    Total pack years: 3.75    Types: Cigarettes   Smokeless tobacco: Never  Vaping Use   Vaping Use: Never used  Substance and Sexual Activity   Alcohol use: Not Currently    Comment: occ   Drug use: No   Sexual activity: Not on file  Other Topics Concern   Not on file  Social History Narrative   Not on file   Social Determinants of Health   Financial Resource Strain: Not on file  Food Insecurity: Not on file  Transportation Needs: Not on file  Physical Activity: Not on file  Stress: Not on file  Social Connections: Not on file  Intimate Partner Violence: Not on file     PHYSICAL EXAM  Vitals:   08/12/21 0903  BP: (!) 178/88  Pulse: 64  Weight: 260 lb (117.9 kg)  Height: 5\' 2"  (1.575 m)    Body mass index is 47.55 kg/m.  Vision Screening (Inadequate exam)   Right eye Left eye Both eyes  Without correction  20/20   With correction     Comments: She was unable to see the right side   General: The patient is well-developed and well-nourished and in no acute distress  HEENT:  Head is [place]/AT.  Sclera are anicteric.  Funduscopic exam shows normal optic discs and retinal vessels.  Neck: No carotid bruits are noted.  The neck is nontender.  Cardiovascular: The heart has a regular rate and rhythm with a normal S1 and S2. There were no murmurs, gallops or rubs.    Skin: Extremities are without rash  or  edema.  Musculoskeletal:  Back is nontender  Neurologic Exam  Mental status: The patient is alert and oriented x 3 at the time of the examination. The patient has apparent normal recent and remote memory, with an apparently normal attention span and concentration ability.   Speech is normal.  Cranial nerves: Extraocular movements are full. Pupils show 3+ right APD.   Reduced acuity OD (cansee hand wavig) and reduced color vision OD.   Facial symmetry is present. There is good facial sensation to soft touch bilaterally.Facial strength is normal.  Trapezius and  sternocleidomastoid strength is normal. No dysarthria is noted.  The tongue is midline, and the patient has symmetric elevation of the soft palate. No obvious hearing deficits are noted.  Motor:  Muscle bulk is normal.   Tone is normal. Strength is  5 / 5 in all 4 extremities.   Sensory: Sensory testing is intact to pinprick, soft touch and vibration sensation in all 4 extremities.  Coordination: Cerebellar testing reveals good finger-nose-finger and heel-to-shin bilaterally.  Gait and station: Station is normal.   Gait is slightly wide. Tandem gait is wide. Romberg is borderline.   Reflexes: Deep tendon reflexes are symmetric and normal bilaterally.   Plantar responses are flexor.      DIAGNOSTIC DATA (LABS, IMAGING, TESTING) - I reviewed patient records, labs, notes, testing and imaging myself where available.  Lab Results  Component Value Date   WBC 11.0 (H) 11/07/2020   HGB 13.9 11/07/2020   HCT 40.7 11/07/2020   MCV 94.2 11/07/2020   PLT 311 11/07/2020      Component Value Date/Time   NA 138 11/07/2020 2000   K 3.9 11/07/2020 2000   CL 106 11/07/2020 2000   CO2 26 11/07/2020 2000   GLUCOSE 72 11/07/2020 2000   BUN 9 11/07/2020 2000   CREATININE 0.90 11/07/2020 2000   CALCIUM 8.8 (L) 11/07/2020 2000   PROT 8.0 11/07/2020 2000   ALBUMIN 4.4 11/07/2020 2000   AST 15 11/07/2020 2000   ALT 16 11/07/2020 2000    ALKPHOS 52 11/07/2020 2000   BILITOT 0.4 11/07/2020 2000   GFRNONAA >60 11/07/2020 2000   GFRAA >60 02/26/2018 2118       ASSESSMENT AND PLAN  Right optic neuritis - Plan: C-reactive protein, Sedimentation rate, ANCA Profile, ANA w/Reflex, Anti-MOG, Serum, Neuromyelitis optica autoab, IgG, MR BRAIN W WO CONTRAST, MR CERVICAL SPINE W WO CONTRAST, MR THORACIC SPINE W WO CONTRAST, QuantiFERON-TB Gold Plus, Stratify JCV Antibody Test ([facility])  Gait disturbance - Plan: MR BRAIN W WO CONTRAST, MR CERVICAL SPINE W WO CONTRAST, MR THORACIC SPINE W WO CONTRAST  Numbness - Plan: C-reactive protein, Sedimentation rate, ANCA Profile, ANA w/Reflex, Anti-MOG, Serum, Neuromyelitis optica autoab, IgG, MR BRAIN W WO CONTRAST, MR CERVICAL SPINE W WO CONTRAST, MR THORACIC SPINE W WO CONTRAST  High risk medication use - Plan: QuantiFERON-TB Gold Plus, Stratify JCV Antibody Test ([facility])    In summary, Hannah Morris is a 36 year old woman who had the onset right sided headache followed by visual loss about 10 days ago and more recently has had numbness in her feet that has now extended to include the groin.  Additionally, for the last few days her gait has been off balance.  On her exam today, she had hand waving vision only on the right and an APD and reduced color vision, all consistent with optic neuritis.  I am concerned that her numbness and gait issues could be due to an other demyelinating focus.  Therefore, we need to check MRI of the brain and spine to determine if she has demyelinating plaques that could be seen with multiple sclerosis, NMO and MOGAD.  Additionally, the MRI will let us rule out some alternative diagnoses.  I will check some lab work for anti-MOG, anti-NMO and vasculitis labs.  She will be given 3 days of high-dose IV steroids 1 g each day.  I was able to get her first dose initiated in the office.  Hopefully this will allow for a quicker recovery.    Based on  the results of the  studies we may need to initiate a disease modifying therapy and I discussed my concerns with her.  I would have her come back to help decide the disease modifying therapy if she does have a demyelinating disease.    I will see her back based on the results of the studies or in a couple months if everything returns normal.  She should call sooner if there are new or worsening neurologic symptoms.  Thank you for asking me to see Ms. but they have for a neurologic consultation.  Please let me know if I can be of further assistance with her or other patients in the future.   Imojean Yoshino A. Epimenio Foot, MD, [facility] 08/12/2021, 9:13 AM Certified in Neurology, Clinical Neurophysiology, Sleep Medicine and Neuroimaging  [facility] [address], [address] [place], [place] [address] [phone]

## 2021-08-12 NOTE — Telephone Encounter (Signed)
Placed J[facility] lab in [facility] lock box for routine lab pick up. Results pending. 

## 2021-08-13 ENCOUNTER — Encounter: Payer: Self-pay | Admitting: Neurology

## 2021-08-15 ENCOUNTER — Ambulatory Visit
Admission: RE | Admit: 2021-08-15 | Discharge: 2021-08-15 | Disposition: A | Discharge status: Home / Self Care | Payer: No Typology Code available for payment source | Source: Ambulatory Visit | Attending: Neurology | Admitting: Neurology

## 2021-08-15 DIAGNOSIS — H469 Unspecified optic neuritis: Secondary | ICD-10-CM

## 2021-08-15 DIAGNOSIS — R200 Anesthesia of skin: Secondary | ICD-10-CM

## 2021-08-15 DIAGNOSIS — R269 Unspecified abnormalities of gait and mobility: Secondary | ICD-10-CM | POA: Diagnosis not present

## 2021-08-15 LAB — ANCA PROFILE
Anti-MPO Antibodies: <0.2 units (ref 0.0–0.9)
Anti-PR3 Antibodies: <0.2 units (ref 0.0–0.9)
Atypical pANCA: <1:20 {titer}
C-ANCA: <1:20 {titer}
P-ANCA: <1:20 {titer}

## 2021-08-15 LAB — QUANTIFERON-TB GOLD PLUS
QuantiFERON Mitogen Value: >10 IU/mL
QuantiFERON Nil Value: 0.06 IU/mL
QuantiFERON TB1 Ag Value: 0.16 IU/mL
QuantiFERON TB2 Ag Value: 0.27 IU/mL
QuantiFERON-TB Gold Plus: NEGATIVE

## 2021-08-15 LAB — ANA W/REFLEX: Anti Nuclear Antibody (ANA): NEGATIVE

## 2021-08-15 LAB — NEUROMYELITIS OPTICA AUTOAB, IGG: NMO IgG Autoantibodies: <1.5 U/mL (ref 0.0–3.0)

## 2021-08-15 LAB — ANTI-MOG, SERUM: MOG Antibody, Cell-based IFA: NEGATIVE

## 2021-08-15 LAB — SEDIMENTATION RATE: Sed Rate: 5 mm/hr (ref 0–32)

## 2021-08-15 LAB — C-REACTIVE PROTEIN: CRP: 11 mg/L — ABNORMAL HIGH (ref 0–10)

## 2021-08-15 MED ORDER — GADOBENATE DIMEGLUMINE 529 MG/ML IV SOLN
20.0000 mL | Freq: Once | INTRAVENOUS | Status: AC | PRN
  Administered 2021-08-15: 20 mL via INTRAVENOUS

## 2021-08-18 ENCOUNTER — Ambulatory Visit
Admission: RE | Admit: 2021-08-18 | Discharge: 2021-08-18 | Disposition: A | Discharge status: Home / Self Care | Payer: No Typology Code available for payment source | Source: Ambulatory Visit | Attending: Neurology | Admitting: Neurology

## 2021-08-18 ENCOUNTER — Encounter: Payer: Self-pay | Admitting: Neurology

## 2021-08-18 ENCOUNTER — Telehealth: Payer: Self-pay | Admitting: Neurology

## 2021-08-18 ENCOUNTER — Ambulatory Visit (INDEPENDENT_AMBULATORY_CARE_PROVIDER_SITE_OTHER): Payer: No Typology Code available for payment source | Admitting: Neurology

## 2021-08-18 DIAGNOSIS — R200 Anesthesia of skin: Secondary | ICD-10-CM | POA: Diagnosis not present

## 2021-08-18 DIAGNOSIS — Z79899 Other long term (current) drug therapy: Secondary | ICD-10-CM

## 2021-08-18 DIAGNOSIS — G35A Relapsing-remitting multiple sclerosis: Secondary | ICD-10-CM | POA: Insufficient documentation

## 2021-08-18 DIAGNOSIS — G35 Multiple sclerosis: Secondary | ICD-10-CM | POA: Diagnosis not present

## 2021-08-18 DIAGNOSIS — H469 Unspecified optic neuritis: Secondary | ICD-10-CM

## 2021-08-18 DIAGNOSIS — R269 Unspecified abnormalities of gait and mobility: Secondary | ICD-10-CM

## 2021-08-18 MED ORDER — GADOBENATE DIMEGLUMINE 529 MG/ML IV SOLN
20.0000 mL | Freq: Once | INTRAVENOUS | Status: AC | PRN
  Administered 2021-08-18: 20 mL via INTRAVENOUS

## 2021-08-19 ENCOUNTER — Telehealth: Payer: Self-pay

## 2021-08-19 LAB — IGG, IGA, IGM
IgA/Immunoglobulin A, Serum: 132 mg/dL (ref 87–352)
IgG (Immunoglobin G), Serum: 1071 mg/dL (ref 586–1602)
IgM (Immunoglobulin M), Srm: 37 mg/dL (ref 26–217)

## 2021-08-19 LAB — HEPATITIS B SURFACE ANTIGEN: Hepatitis B Surface Ag: NEGATIVE

## 2021-08-19 LAB — HEPATITIS C ANTIBODY: Hep C Virus Ab: NONREACTIVE

## 2021-08-19 LAB — VARICELLA ZOSTER ANTIBODY, IGG: Varicella zoster IgG: 2445 index (ref >165)

## 2021-08-19 LAB — HEPATITIS B SURFACE ANTIBODY,QUALITATIVE: Hep B Surface Ab, Qual: REACTIVE

## 2021-08-19 LAB — HEPATITIS B CORE ANTIBODY, TOTAL: Hep B Core Total Ab: NEGATIVE

## 2021-08-19 LAB — HIV ANTIBODY (ROUTINE TESTING W REFLEX): HIV Screen 4th Generation wRfx: NONREACTIVE

## 2021-09-04 ENCOUNTER — Emergency Department (HOSPITAL_BASED_OUTPATIENT_CLINIC_OR_DEPARTMENT_OTHER)
Admission: EM | Admit: 2021-09-04 | Discharge: 2021-09-04 | Disposition: A | Discharge status: Home / Self Care | Payer: No Typology Code available for payment source | Attending: Emergency Medicine | Admitting: Emergency Medicine

## 2021-09-04 ENCOUNTER — Other Ambulatory Visit: Payer: Self-pay

## 2021-09-04 DIAGNOSIS — T7840XA Allergy, unspecified, initial encounter: Secondary | ICD-10-CM | POA: Diagnosis not present

## 2021-09-04 DIAGNOSIS — H11421 Conjunctival edema, right eye: Secondary | ICD-10-CM | POA: Diagnosis not present

## 2021-09-04 DIAGNOSIS — H5789 Other specified disorders of eye and adnexa: Secondary | ICD-10-CM | POA: Diagnosis present

## 2021-09-04 MED ORDER — DIPHENHYDRAMINE HCL 25 MG PO CAPS
25.0000 mg | ORAL_CAPSULE | Freq: Once | ORAL | Status: AC
  Administered 2021-09-04: 25 mg via ORAL
  Filled 2021-09-04: qty 1

## 2021-09-04 MED ORDER — EPINEPHRINE 0.3 MG/0.3ML IJ SOAJ: 0.3000 mg | INTRAMUSCULAR | 0 refills | Status: DC | PRN

## 2021-09-04 NOTE — Discharge Instructions (Addendum)
Do NOT consume seafood until further discussion with your primary care and allergist. Zyrtec, Benadryl, Pepcid can all be taken as directed for this reaction. Return to the ER for any worsening of concerning symptoms.

## 2021-09-04 NOTE — ED Triage Notes (Signed)
Patient presents to ED via POV from home. Patient was preparing lobster PTA and wiped her eye. Presents with right orbital edema. Denies shortness of breath or difficulty breathing. No known allergy to lobster. Denies changes in vision.

## 2021-09-04 NOTE — ED Provider Notes (Signed)
[facility] EMERGENCY DEPARTMENT Provider Note   CSN: [id number] Arrival date & time: 09/04/21  1729     History  Chief Complaint  Patient presents with   Eye Pain    Hannah Morris is a 36 y.o. female.  36 yo female who presents to the ED with concern of right eye itching and redness x 30 min. She reports the symptoms began immediately after whipping her eye while making lobster. She denies an allergy to shell fish and notes the last time she ate lobster was about a month ago and she had no abnormal reaction. She also reports itching in her right finger where she believed she cut herself with a shell while cooking. she denies any vomiting, nauseas, difficulty breathing, and sensation of throat swelling. She denies any history of allergies. Her history is significant for right sided optic neuritis and MS. She is not current taking medications.        Home Medications Prior to Admission medications   Medication Sig Start Date End Date Taking? Authorizing Provider  EPINEPHrine 0.3 mg/0.3 mL IJ SOAJ injection Inject 0.3 mg into the muscle as needed for anaphylaxis. 09/04/21  Yes Jeannie Fend, PA-C  ibuprofen (ADVIL) 200 MG tablet Take 200 mg by mouth 3 (three) times daily as needed for headache.    [provider]  Vitamin D, Ergocalciferol, (DRISDOL) 1.25 MG (50000 UNIT) CAPS capsule Take 50,000 Units by mouth every Monday. 06/17/21   [provider]      Allergies    Patient has no known allergies.    Review of Systems   Review of Systems Negative except as per HPI Physical Exam Updated Vital Signs BP 139/88 (BP Location: Left Arm)   Pulse 66   Temp 98.6 F (37 C) (Oral)   Resp 16   SpO2 99%  Physical Exam Vitals and nursing note reviewed.  Constitutional:      General: She is not in acute distress.    Appearance: She is well-developed. She is not diaphoretic.  HENT:     Head: Normocephalic and atraumatic.     Mouth/Throat:     Mouth:  Mucous membranes are moist.  Eyes:     Extraocular Movements: Extraocular movements intact.     Conjunctiva/sclera:     Right eye: Right conjunctiva is injected. Chemosis present.     Left eye: Left conjunctiva is not injected. No chemosis.    Pupils: Pupils are equal, round, and reactive to light.  Pulmonary:     Effort: Pulmonary effort is normal.  Musculoskeletal:        General: Normal range of motion.     Cervical back: Neck supple.     Comments: Mild swelling and redness noted to right index finger  Skin:    General: Skin is warm and dry.     Findings: Erythema present.  Neurological:     Mental Status: She is alert and oriented to person, place, and time.  Psychiatric:        Behavior: Behavior normal.     ED Results / Procedures / Treatments   Labs (all labs ordered are listed, but only abnormal results are displayed) Labs Reviewed - No data to display  EKG None  Radiology No results found.  Procedures Procedures    Medications Ordered in ED Medications  diphenhydrAMINE (BENADRYL) capsule 25 mg (25 mg Oral Given 09/04/21 1832)    ED Course/ Medical Decision Making/ A&P  Medical Decision Making  36 year old female presents with concern for eye redness and right finger redness/swelling after exposure to shellfish today.  Patient states that she was cooking lobster and rubbed her eye which resulted in swelling of the eye.  Also notes that part of the shell punctured her finger she has swelling and redness of the finger.  She is noted to have mild chemosis of the right eye with slight injection.  Denies respiratory complaints or vomiting. Recommend Benadryl, Zyrtec, Pepcid. Given epipen Rx and advised not to consume shellfish until cleared by allergist.        Final Clinical Impression(s) / ED Diagnoses Final diagnoses:  Allergic reaction, initial encounter    Rx / DC Orders ED Discharge Orders          Ordered     EPINEPHrine 0.3 mg/0.3 mL IJ SOAJ injection  As needed        09/04/21 1824              Jeannie Fend, PA-C 09/04/21 1848    Franne Forts, DO 09/05/21 0000

## 2021-09-12 ENCOUNTER — Telehealth: Payer: Self-pay | Admitting: Neurology

## 2021-09-12 NOTE — Telephone Encounter (Signed)
[facility] called, verify diagnosis of MS, office visit hospital medication prescribed , any surgery. Also requesting information by fax to [phone] Would like a call from the nurse. Reference no: [id number]

## 2021-09-15 NOTE — Telephone Encounter (Signed)
Can you help with the medical record request? Please send OV notes, labs, any imaging reports. Thank you!

## 2021-09-15 NOTE — Telephone Encounter (Signed)
Received short term disability paperwork for the patient to be completed. I have provided this to our medical records team for processing and then once they have completed that we will complete the paperwork.

## 2021-09-17 NOTE — Telephone Encounter (Signed)
I was able to speak with patient regarding $50 form fee. She is going to make the payment when she comes in 8/1 for her infusion. Bringing form to POD 1 for completion. Patient was advised that form will not be sent without payment being made.

## 2021-09-22 NOTE — Telephone Encounter (Signed)
Gave completed/signed form back to medical records to process for pt. 

## 2021-10-06 NOTE — Telephone Encounter (Signed)
Patient's first Ocrevus 300mg  infusion was 09/23/2021.

## 2021-10-07 ENCOUNTER — Encounter: Payer: Self-pay | Admitting: Neurology

## 2021-10-22 ENCOUNTER — Encounter: Payer: Self-pay | Admitting: Neurology

## 2021-10-22 ENCOUNTER — Ambulatory Visit (INDEPENDENT_AMBULATORY_CARE_PROVIDER_SITE_OTHER): Payer: No Typology Code available for payment source | Admitting: Neurology

## 2021-10-22 VITALS — BP 178/99 | HR 61 | Ht 62.0 in | Wt 258.0 lb

## 2021-10-22 DIAGNOSIS — R269 Unspecified abnormalities of gait and mobility: Secondary | ICD-10-CM

## 2021-10-22 DIAGNOSIS — H469 Unspecified optic neuritis: Secondary | ICD-10-CM | POA: Diagnosis not present

## 2021-10-22 DIAGNOSIS — Z79899 Other long term (current) drug therapy: Secondary | ICD-10-CM | POA: Diagnosis not present

## 2021-10-22 DIAGNOSIS — G35D Multiple sclerosis, unspecified: Secondary | ICD-10-CM

## 2021-10-22 DIAGNOSIS — R200 Anesthesia of skin: Secondary | ICD-10-CM

## 2021-10-22 DIAGNOSIS — G35 Multiple sclerosis: Secondary | ICD-10-CM | POA: Diagnosis not present

## 2021-10-22 NOTE — Progress Notes (Signed)
[facility]  PATIENT: Hannah Morris DOB: [birth year]  REFERRING DOCTOR OR PCP: Melene Plan, DO (referral); [facility] (PCP) SOURCE: Patient, notes from emergency room, lab results, imaging results, CT scan from 2020 personally reviewed.  _________________________________   HISTORICAL  CHIEF COMPLAINT:  Chief Complaint  Patient presents with   Follow-up    RM 1, alone. Last seen 08/18/21. MS DMT: Ocrevus. Feels she has had SE since she received 2nd infusion 10/07/21. Had L sided numbness. Has had migraine in R eye. Right side of stomach numb. Shooting pain in ankles, feels like someone gripping R knee. If she sits too long, R knee hurts. If she stands too long, R knee buckles. Had blurry vision associated w/ migraine last night but has resolved today.     HISTORY OF PRESENT ILLNESS:  Hannah Morris is a 36 y.o. woman who is diagnosed with relapsing remitting MS after presenting with right optic neuritis early June 2023  Update 10/22/2021: Her first Ocrevus infusion went well but she reported abdominal numb sensation after the second one 2 weeks ago.  For a while it went down the leg  Numbness I better the last few days.    She has had a HA x 3 days.  SHe felt vision was blurry yesterday   No N/V but has photo/phonophobia.    Moving is not altering pain much.   Tylenol takes the edge off some.     She had ON on the right the onset a few months ago. Vision is much better but not baseline.   She can read now.    He continues to experience some numbness in the legs, left greater than right.  The numbness in the groin has resolved.  She does not note any bowel or bladder difficulties.  Her gait impairment/ataxia has improved.  She denies much fatigue.    She sleeps poorly but this is chronic,    She notes no difficulty with cognition or dperession.   She has had some anxiety with current health issues.        History of MS: She i began to experience right  eye pain/headace in June 2023.  Pain was worse with movements during the first few days.   Advil helped slightly.   The pain was centered in and around the right eye.    Visual loss on the right started a day or two after the eye pain started.   Initially, visual acuity was reduced with a gray veil but over 2-3 days, vision was nearly completely lost.    About a week after the visual loss began, she had the onset of numbness initially in the legs and then in the groin.  Additionally, gait disturbance and gait ataxia was noted.  MS was suspected an MRI of the brain and spine was performed June 2023.  They showed multiple supratentorial and infratentorial lesions in the brain and multiple foci in the spinal cord.  At least one focus in the spinal cord and one focus in the brain enhanced after contrast.  She is otherwise in good health.   Vit D was low and she takes a supplement x 1 month.   She had gestational diabetes x 4 pregnancies.     No FH of MS.  Imaging: MRI of the brain 08/15/2021 showed  multiple T2/FLAIR hyperintense foci in the cerebral hemispheres in a few other foci in the cerebral hemispheres, pons and thalamus in a pattern consistent with chronic demyelinating  plaque associated with multiple sclerosis.  1 small focus in the juxtacortical white matter of the left parietal lobe enhanced after contrast administration consistent with an acute demyelinating plaque  MRI of the cervical spine 08/15/2021 showed T2 hyperintense foci within the spinal cord towards the right adjacent to C2-C3, posteriory adjacent to C3, posterolaterally to the left adjacent to C4, anteriorly to the left adjacent to C5, and towards the right adjacent to C6-C7.   there appears to be very subtle enhancement of the focus adjacent to C3 and the focus adjacent to C6-C7.  The C6-C7 enhancement is also seen on axial images.  Additionally, she had mild spinal stenosis at C3-C4 and C5-C6 but no nerve root compression.  MRI of the  thoracic spine 08/18/2021 showed foci centrally adjacent to T1-T2, posterolaterally to the right adjacent to T3, posteriorly, slightly to the right adjacent to T8-T9, to the right adjacent to T9, posteriorly adjacent to T10 through T10-T11, posteriorly adjacent to T11-T12.  There is punctate enhancement involving the focus at T10-T11 after contrast was administered  CT scan of the head 01/25/2019 was normal.   REVIEW OF SYSTEMS: Constitutional: No fevers, chills, sweats, or change in appetite Eyes: As above. Ear, nose and throat: No hearing loss, ear pain, nasal congestion, sore throat Cardiovascular: No chest pain, palpitations Respiratory:  No shortness of breath at rest or with exertion.   No wheezes GastrointestinaI: No nausea, vomiting, diarrhea, abdominal pain, fecal incontinence Genitourinary:  No dysuria, urinary retention or frequency.  No nocturia. Musculoskeletal:  No neck pain, back pain Integumentary: No rash, pruritus, skin lesions Neurological: as above Psychiatric: No depression at this time.  No anxiety Endocrine: No palpitations, diaphoresis, change in appetite, change in weigh or increased thirst.  Gestational diabetes. Hematologic/Lymphatic:  No anemia, purpura, petechiae. Allergic/Immunologic: No itchy/runny eyes, nasal congestion, recent allergic reactions, rashes  ALLERGIES: No Known Allergies  HOME MEDICATIONS:  Current Outpatient Medications:    acetaminophen (TYLENOL) 325 MG tablet, Take 650 mg by mouth every 6 (six) hours as needed., Disp: , Rfl:    ocrelizumab (OCREVUS) 300 MG/10ML injection, Inject 600 mg into the vein every 6 (six) months., Disp: , Rfl:   PAST MEDICAL HISTORY: Past Medical History:  Diagnosis Date   Asthma    Gestational diabetes     PAST SURGICAL HISTORY: Past Surgical History:  Procedure Laterality Date   TONSILLECTOMY AND ADENOIDECTOMY      FAMILY HISTORY: Family History  Problem Relation Age of Onset   Hypertension Mother     Hypertension Father     SOCIAL HISTORY:  Social History   Socioeconomic History   Marital status: Married    Spouse name: Not on file   Number of children: Not on file   Years of education: Not on file   Highest education level: Not on file  Occupational History   Not on file  Tobacco Use   Smoking status: Every Day    Packs/day: 0.25    Years: 15.00    Total pack years: 3.75    Types: Cigarettes   Smokeless tobacco: Never  Vaping Use   Vaping Use: Never used  Substance and Sexual Activity   Alcohol use: Not Currently    Comment: occ   Drug use: No   Sexual activity: Not on file  Other Topics Concern   Not on file  Social History Narrative   Not on file   Social Determinants of Health   Financial Resource Strain: Not on file  Food  Insecurity: Not on file  Transportation Needs: Not on file  Physical Activity: Not on file  Stress: Not on file  Social Connections: Not on file  Intimate Partner Violence: Not on file     PHYSICAL EXAM  Vitals:   10/22/21 1356  BP: (!) 178/99  Pulse: 61  Weight: 258 lb (117 kg)  Height: 5\' 2"  (1.575 m)     Body mass index is 47.19 kg/m.  No results found.  General: The patient is well-developed and well-nourished and in no acute distress  HEENT:  Head is [place]/AT.  Sclera are anicteric.  Neck: She has mild occipital tenderness on the right   Skin: Extremities are without rash or  edema.  Musculoskeletal:  Back is nontender  Neurologic Exam  Mental status: The patient is alert and oriented x 3 at the time of the examination. The patient has apparent normal recent and remote memory, with an apparently normal attention span and concentration ability.   Speech is normal.  Cranial nerves: Extraocular movements are full. Pupils show 2+ right APD.   Reduced acuity OD (can read now though) and mildly reduced color vision OD.   Facial symmetry is present. There is good facial sensation to soft touch bilaterally.Facial  strength is normal.  Trapezius and sternocleidomastoid strength is normal. No dysarthria is noted.    No obvious hearing deficits are noted.  Motor:  Muscle bulk is normal.   Tone is normal. Strength is  5 / 5 in all 4 extremities.   Sensory: Sensory testing is intact to pinprick, soft touch and vibration sensation in the arms and she reports reduced vibration in the left leg relative to the right  Coordination: Cerebellar testing reveals good finger-nose-finger and heel-to-shin bilaterally.  Gait and station: Station is normal.   Gait has mild right limp and is mildly wide.   Tandem gait is wide.  The Romberg is negative. Reflexes: Deep tendon reflexes are symmetric and normal in arms and increased at the knees and normal at ankles..    .      DIAGNOSTIC DATA (LABS, IMAGING, TESTING) - I reviewed patient records, labs, notes, testing and imaging myself where available.  Lab Results  Component Value Date   WBC 11.0 (H) 11/07/2020   HGB 13.9 11/07/2020   HCT 40.7 11/07/2020   MCV 94.2 11/07/2020   PLT 311 11/07/2020      Component Value Date/Time   NA 138 11/07/2020 2000   K 3.9 11/07/2020 2000   CL 106 11/07/2020 2000   CO2 26 11/07/2020 2000   GLUCOSE 72 11/07/2020 2000   BUN 9 11/07/2020 2000   CREATININE 0.90 11/07/2020 2000   CALCIUM 8.8 (L) 11/07/2020 2000   PROT 8.0 11/07/2020 2000   ALBUMIN 4.4 11/07/2020 2000   AST 15 11/07/2020 2000   ALT 16 11/07/2020 2000   ALKPHOS 52 11/07/2020 2000   BILITOT 0.4 11/07/2020 2000   GFRNONAA >60 11/07/2020 2000   GFRAA >60 02/26/2018 2118       ASSESSMENT AND PLAN  Multiple sclerosis (HCC)  High risk medication use  Right optic neuritis  Gait disturbance  Numbness   Continue Ocrevus.  Possible small exacerbation at onset of treatment but not significant enough for steroid infusion.     Check labs next visit.   Check MRI around time of next visit and consider a change in therapy if there is subclinical  progression..   stay active and exercise as tolerated.  Continue vitamin D supplements.  Return in 6 months or sooner for new or worsening neurologic symptoms.  40-minute office visit with the majority of the time spent face-to-face for history and physical, discussion/counseling and decision-making.  Additional time with record review and documentation.   Zachory Mangual A. Epimenio Foot, MD, [facility] 10/22/2021, 2:25 PM Certified in Neurology, Clinical Neurophysiology, Sleep Medicine and Neuroimaging  [facility] [address] [place], [place] [address] [phone]

## 2021-10-28 ENCOUNTER — Encounter: Payer: Self-pay | Admitting: Neurology

## 2021-11-10 NOTE — Telephone Encounter (Signed)
Gave completed/signed form back to medical records to process for pt. 

## 2021-11-11 ENCOUNTER — Telehealth: Payer: Self-pay | Admitting: *Deleted

## 2021-11-11 NOTE — Telephone Encounter (Signed)
Pt unum form faxed on 11/11/2021

## 2021-12-01 ENCOUNTER — Encounter: Payer: Self-pay | Admitting: Neurology

## 2021-12-31 ENCOUNTER — Other Ambulatory Visit: Payer: Self-pay

## 2021-12-31 ENCOUNTER — Emergency Department (HOSPITAL_BASED_OUTPATIENT_CLINIC_OR_DEPARTMENT_OTHER)
Admission: EM | Admit: 2021-12-31 | Discharge: 2021-12-31 | Discharge status: Against Medical Advice | Payer: No Typology Code available for payment source | Attending: Emergency Medicine | Admitting: Emergency Medicine

## 2021-12-31 ENCOUNTER — Encounter (HOSPITAL_BASED_OUTPATIENT_CLINIC_OR_DEPARTMENT_OTHER): Payer: Self-pay | Admitting: Pediatrics

## 2021-12-31 DIAGNOSIS — Z5321 Procedure and treatment not carried out due to patient leaving prior to being seen by health care provider: Secondary | ICD-10-CM | POA: Diagnosis not present

## 2021-12-31 DIAGNOSIS — R220 Localized swelling, mass and lump, head: Secondary | ICD-10-CM | POA: Insufficient documentation

## 2021-12-31 DIAGNOSIS — K0889 Other specified disorders of teeth and supporting structures: Secondary | ICD-10-CM | POA: Insufficient documentation

## 2021-12-31 NOTE — ED Triage Notes (Signed)
C/O swelling on right side of mouth since last night; reported hx of broken tooth on the same side and has some dental pain as well.

## 2022-04-24 ENCOUNTER — Encounter: Payer: Self-pay | Admitting: Neurology

## 2022-07-19 IMAGING — CR DG CHEST 2V
2 series · 2 of 2 positions shown · non-contrast
Comparison: 01/28/2020 at [DATE] p.m.

CLINICAL DATA: Chest pain and short of breath rule 1 week

EXAM:
CHEST - 2 VIEW

[w chest pa]
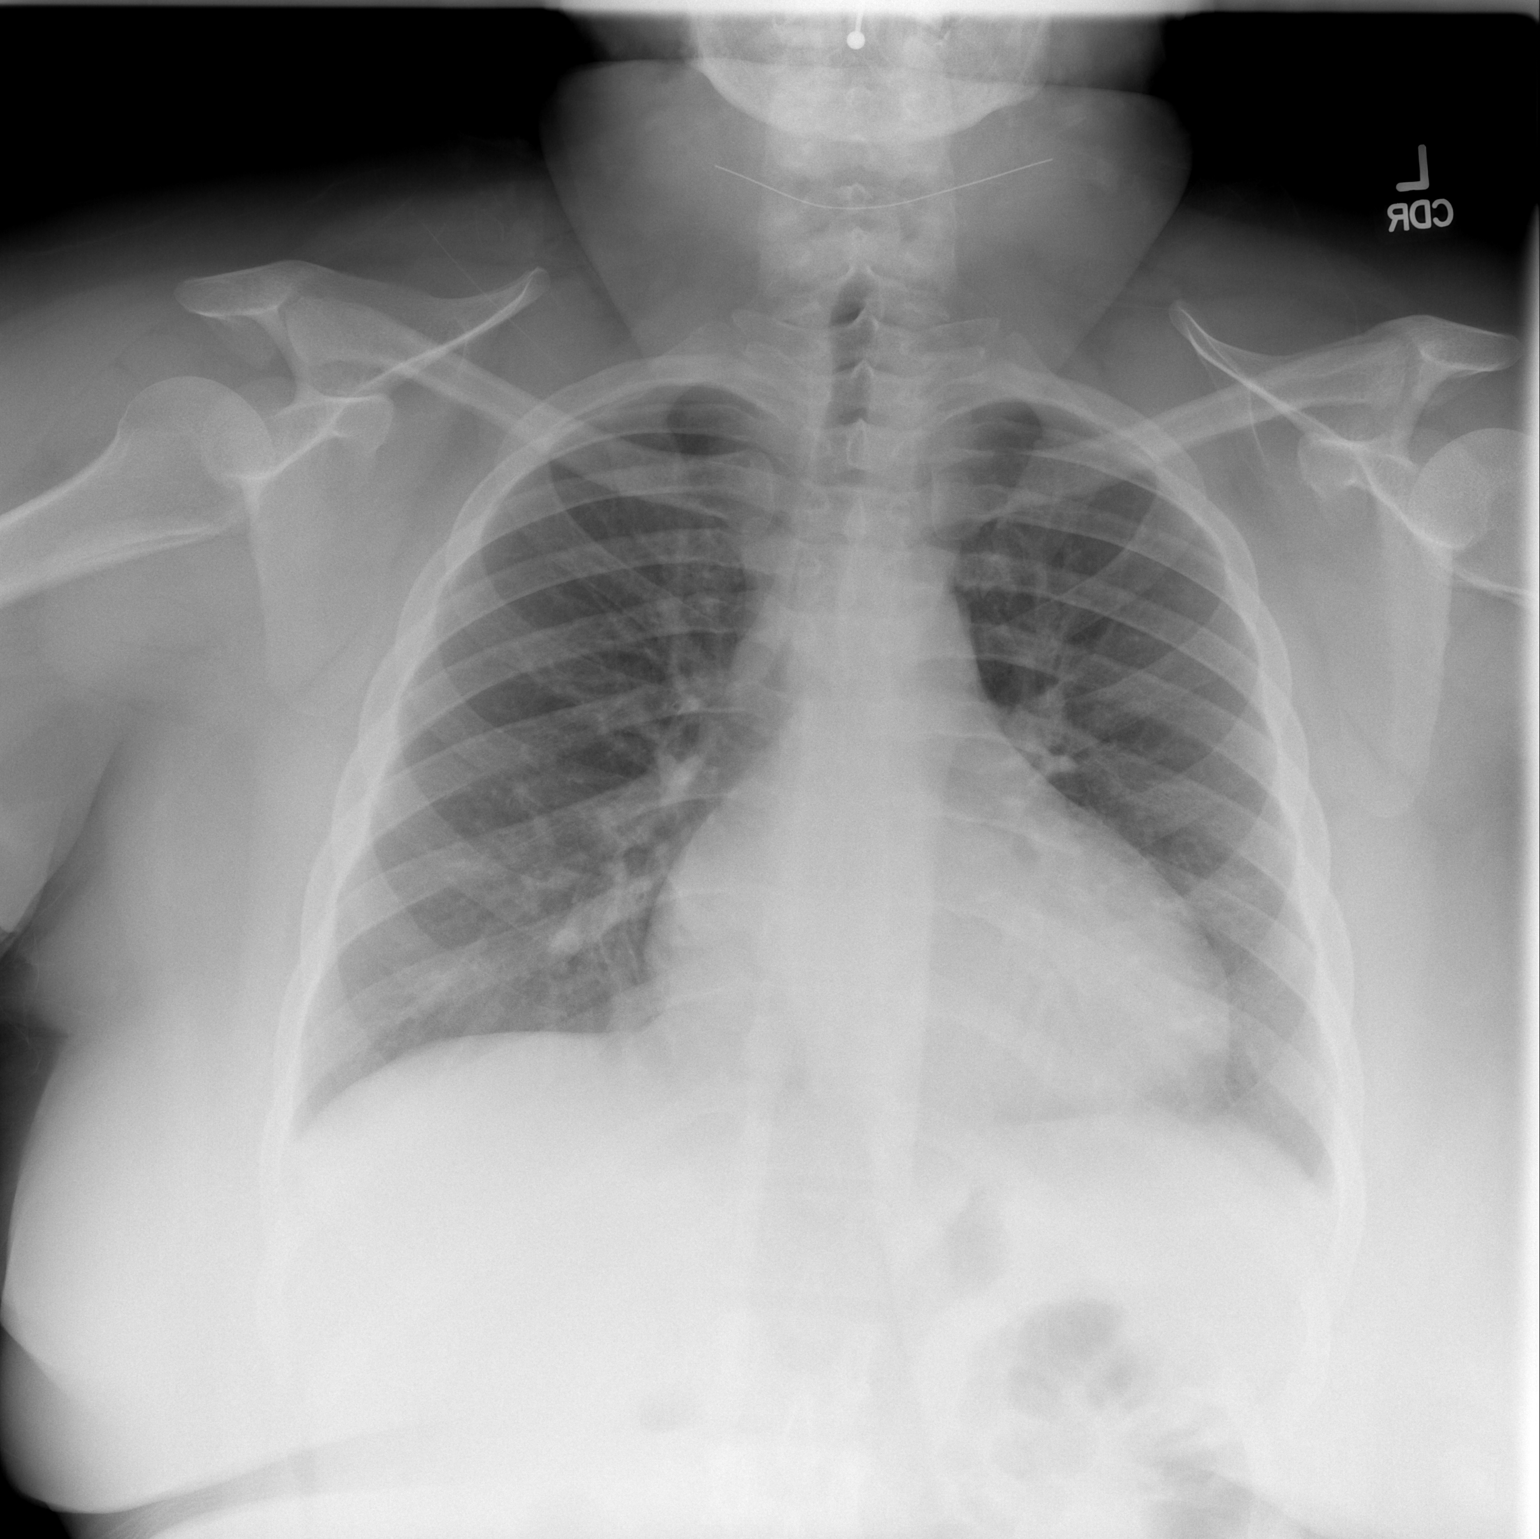

[w chest lat]
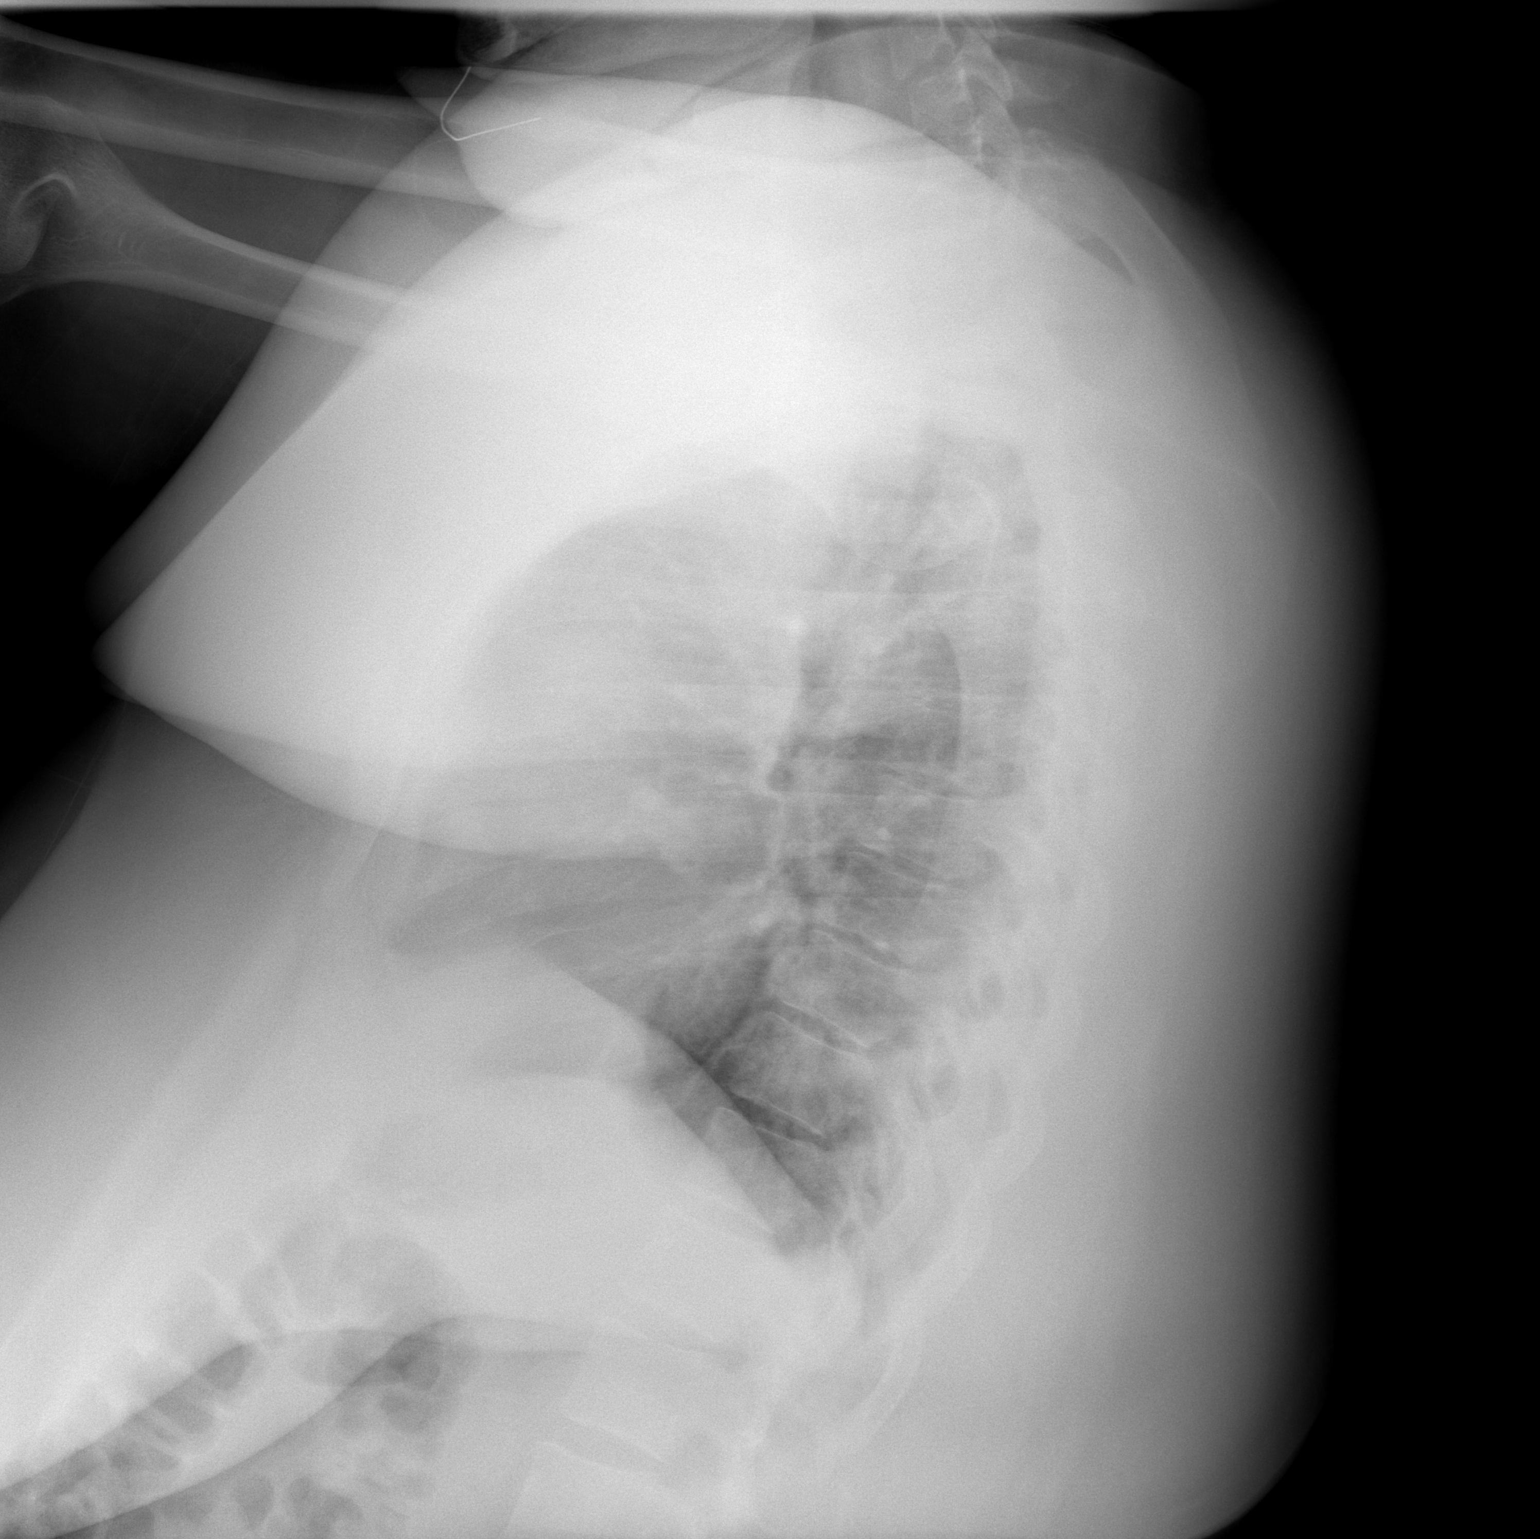

[2 of 2 positions shown; findings below may reference images not displayed]

FINDINGS: Frontal and lateral views of the chest demonstrate an unremarkable
cardiac silhouette. No airspace disease, effusion, or pneumothorax.
No acute bony abnormalities.
IMPRESSION: 1. No acute intrathoracic process.

## 2022-09-03 ENCOUNTER — Telehealth: Payer: Self-pay | Admitting: *Deleted

## 2022-10-14 NOTE — Progress Notes (Unsigned)
[facility]  PATIENT: Hannah Morris DOB: 1985/03/24  REFERRING DOCTOR OR PCP: Melene Plan, DO (referral); [facility] (PCP) SOURCE: Patient, notes from emergency room, lab results, imaging results, CT scan from 2020 personally reviewed.  _________________________________   HISTORICAL  CHIEF COMPLAINT:  No chief complaint on file.   HISTORY OF PRESENT ILLNESS:  Hannah Morris is a 37 y.o. woman who is diagnosed with relapsing remitting MS after presenting with right optic neuritis early June 2023  Update 10/14/2022 JM: Returns for follow-up visit.  Prior visit with Dr. Epimenio Foot 1 year ago.  Continues on Ocrevus infusion, next infusion scheduled *** Her first Ocrevus infusion went well but she reported abdominal numb sensation after the second one 2 weeks ago.  For a while it went down the leg  Numbness I better the last few days.    She has had a HA x 3 days.  SHe felt vision was blurry yesterday   No N/V but has photo/phonophobia.    Moving is not altering pain much.   Tylenol takes the edge off some.     She had ON on the right the onset a few months ago. Vision is much better but not baseline.   She can read now.    He continues to experience some numbness in the legs, left greater than right.  The numbness in the groin has resolved.  She does not note any bowel or bladder difficulties.  Her gait impairment/ataxia has improved.  She denies much fatigue.    She sleeps poorly but this is chronic,    She notes no difficulty with cognition or dperession.   She has had some anxiety with current health issues.        History of MS: She i began to experience right eye pain/headace in June 2023.  Pain was worse with movements during the first few days.   Advil helped slightly.   The pain was centered in and around the right eye.    Visual loss on the right started a day or two after the eye pain started.   Initially, visual acuity was reduced with a gray veil  but over 2-3 days, vision was nearly completely lost.    About a week after the visual loss began, she had the onset of numbness initially in the legs and then in the groin.  Additionally, gait disturbance and gait ataxia was noted.  MS was suspected an MRI of the brain and spine was performed June 2023.  They showed multiple supratentorial and infratentorial lesions in the brain and multiple foci in the spinal cord.  At least one focus in the spinal cord and one focus in the brain enhanced after contrast.  She is otherwise in good health.   Vit D was low and she takes a supplement x 1 month.   She had gestational diabetes x 4 pregnancies.     No FH of MS.  Imaging: MRI of the brain 08/15/2021 showed  multiple T2/FLAIR hyperintense foci in the cerebral hemispheres in a few other foci in the cerebral hemispheres, pons and thalamus in a pattern consistent with chronic demyelinating plaque associated with multiple sclerosis.  1 small focus in the juxtacortical white matter of the left parietal lobe enhanced after contrast administration consistent with an acute demyelinating plaque  MRI of the cervical spine 08/15/2021 showed T2 hyperintense foci within the spinal cord towards the right adjacent to C2-C3, posteriory adjacent to C3, posterolaterally to the left adjacent  to C4, anteriorly to the left adjacent to C5, and towards the right adjacent to C6-C7.   there appears to be very subtle enhancement of the focus adjacent to C3 and the focus adjacent to C6-C7.  The C6-C7 enhancement is also seen on axial images.  Additionally, she had mild spinal stenosis at C3-C4 and C5-C6 but no nerve root compression.  MRI of the thoracic spine 08/18/2021 showed foci centrally adjacent to T1-T2, posterolaterally to the right adjacent to T3, posteriorly, slightly to the right adjacent to T8-T9, to the right adjacent to T9, posteriorly adjacent to T10 through T10-T11, posteriorly adjacent to T11-T12.  There is punctate  enhancement involving the focus at T10-T11 after contrast was administered  CT scan of the head 01/25/2019 was normal.   REVIEW OF SYSTEMS: Constitutional: No fevers, chills, sweats, or change in appetite Eyes: As above. Ear, nose and throat: No hearing loss, ear pain, nasal congestion, sore throat Cardiovascular: No chest pain, palpitations Respiratory:  No shortness of breath at rest or with exertion.   No wheezes GastrointestinaI: No nausea, vomiting, diarrhea, abdominal pain, fecal incontinence Genitourinary:  No dysuria, urinary retention or frequency.  No nocturia. Musculoskeletal:  No neck pain, back pain Integumentary: No rash, pruritus, skin lesions Neurological: as above Psychiatric: No depression at this time.  No anxiety Endocrine: No palpitations, diaphoresis, change in appetite, change in weigh or increased thirst.  Gestational diabetes. Hematologic/Lymphatic:  No anemia, purpura, petechiae. Allergic/Immunologic: No itchy/runny eyes, nasal congestion, recent allergic reactions, rashes  ALLERGIES: No Known Allergies  HOME MEDICATIONS:  Current Outpatient Medications:    acetaminophen (TYLENOL) 325 MG tablet, Take 650 mg by mouth every 6 (six) hours as needed., Disp: , Rfl:    ocrelizumab (OCREVUS) 300 MG/10ML injection, Inject 600 mg into the vein every 6 (six) months., Disp: , Rfl:   PAST MEDICAL HISTORY: Past Medical History:  Diagnosis Date   Asthma    Gestational diabetes     PAST SURGICAL HISTORY: Past Surgical History:  Procedure Laterality Date   TONSILLECTOMY AND ADENOIDECTOMY      FAMILY HISTORY: Family History  Problem Relation Age of Onset   Hypertension Mother    Hypertension Father     SOCIAL HISTORY:  Social History   Socioeconomic History   Marital status: Married    Spouse name: Not on file   Number of children: Not on file   Years of education: Not on file   Highest education level: Not on file  Occupational History   Not on  file  Tobacco Use   Smoking status: Every Day    Current packs/day: 0.25    Average packs/day: 0.3 packs/day for 15.0 years (3.8 ttl pk-yrs)    Types: Cigarettes   Smokeless tobacco: Never  Vaping Use   Vaping status: Never Used  Substance and Sexual Activity   Alcohol use: Not Currently    Comment: occ   Drug use: No   Sexual activity: Not on file  Other Topics Concern   Not on file  Social History Narrative   Not on file   Social Determinants of Health   Financial Resource Strain: Not on file  Food Insecurity: Not on file  Transportation Needs: Not on file  Physical Activity: Not on file  Stress: Not on file  Social Connections: Not on file  Intimate Partner Violence: Not on file     PHYSICAL EXAM  There were no vitals filed for this visit.    There is no height or  weight on file to calculate BMI.  No results found.  General: The patient is well-developed and well-nourished and in no acute distress  HEENT:  Head is [place]/AT.  Sclera are anicteric.  Neck: She has mild occipital tenderness on the right   Skin: Extremities are without rash or  edema.  Musculoskeletal:  Back is nontender  Neurologic Exam  Mental status: The patient is alert and oriented x 3 at the time of the examination. The patient has apparent normal recent and remote memory, with an apparently normal attention span and concentration ability.   Speech is normal.  Cranial nerves: Extraocular movements are full. Pupils show 2+ right APD.   Reduced acuity OD (can read now though) and mildly reduced color vision OD.   Facial symmetry is present. There is good facial sensation to soft touch bilaterally.Facial strength is normal.  Trapezius and sternocleidomastoid strength is normal. No dysarthria is noted.    No obvious hearing deficits are noted.  Motor:  Muscle bulk is normal.   Tone is normal. Strength is  5 / 5 in all 4 extremities.   Sensory: Sensory testing is intact to pinprick, soft touch  and vibration sensation in the arms and she reports reduced vibration in the left leg relative to the right  Coordination: Cerebellar testing reveals good finger-nose-finger and heel-to-shin bilaterally.  Gait and station: Station is normal.   Gait has mild right limp and is mildly wide.   Tandem gait is wide.  The Romberg is negative. Reflexes: Deep tendon reflexes are symmetric and normal in arms and increased at the knees and normal at ankles..    .      DIAGNOSTIC DATA (LABS, IMAGING, TESTING) - I reviewed patient records, labs, notes, testing and imaging myself where available.  Lab Results  Component Value Date   WBC 11.0 (H) 11/07/2020   HGB 13.9 11/07/2020   HCT 40.7 11/07/2020   MCV 94.2 11/07/2020   PLT 311 11/07/2020      Component Value Date/Time   NA 138 11/07/2020 2000   K 3.9 11/07/2020 2000   CL 106 11/07/2020 2000   CO2 26 11/07/2020 2000   GLUCOSE 72 11/07/2020 2000   BUN 9 11/07/2020 2000   CREATININE 0.90 11/07/2020 2000   CALCIUM 8.8 (L) 11/07/2020 2000   PROT 8.0 11/07/2020 2000   ALBUMIN 4.4 11/07/2020 2000   AST 15 11/07/2020 2000   ALT 16 11/07/2020 2000   ALKPHOS 52 11/07/2020 2000   BILITOT 0.4 11/07/2020 2000   GFRNONAA >60 11/07/2020 2000   GFRAA >60 02/26/2018 2118       ASSESSMENT AND PLAN  No diagnosis found.   Continue Ocrevus.  Possible small exacerbation at onset of treatment but not significant enough for steroid infusion.     Check labs next visit.   Check MRI around time of next visit and consider a change in therapy if there is subclinical progression..   stay active and exercise as tolerated.  Continue vitamin D supplements. Return in 6 months or sooner for new or worsening neurologic symptoms.      I spent *** minutes of face-to-face and non-face-to-face time with patient.  This included previsit chart review, lab review, study review, order entry, electronic health record documentation, patient education and  discussion regarding above diagnoses and treatment plan and answered all other questions to patient's satisfaction  Ihor Austin, [facility]  [facility] [address] [place], [place] [address]  Phone [phone] Fax [phone]  Note: This document was prepared with digital dictation and possible smart phrase technology. Any transcriptional errors that result from this process are unintentional.

## 2022-10-15 ENCOUNTER — Ambulatory Visit (INDEPENDENT_AMBULATORY_CARE_PROVIDER_SITE_OTHER): Payer: No Typology Code available for payment source | Admitting: Adult Health

## 2022-10-15 ENCOUNTER — Telehealth: Payer: Self-pay | Admitting: Adult Health

## 2022-10-15 ENCOUNTER — Encounter: Payer: Self-pay | Admitting: Adult Health

## 2022-10-15 VITALS — BP 142/86 | HR 80 | Ht 62.0 in | Wt 263.0 lb

## 2022-10-15 DIAGNOSIS — Z79899 Other long term (current) drug therapy: Secondary | ICD-10-CM

## 2022-10-15 DIAGNOSIS — G35 Multiple sclerosis: Secondary | ICD-10-CM

## 2022-10-15 MED ORDER — DIAZEPAM 2 MG PO TABS
2.0000 mg | ORAL_TABLET | ORAL | 0 refills | Status: AC | PRN
Start: 2022-10-15 — End: ?

## 2022-10-15 NOTE — Telephone Encounter (Signed)
sent to GI they obtain [employer] aut[employer]. MR brain medicaid auth: [id number] exp. 10/15/22-11/14/22 [phone]  Cervical and thoracic are pending faxed notes tracking #[id number]

## 2022-10-15 NOTE — Patient Instructions (Signed)
We will repeat imaging to look for any progression of your MS  We will check lab work today - I will notify our infusion center in order to get you rescheduled      Follow up with Dr. Epimenio Foot in 6 months or call earlier if needed

## 2022-10-16 LAB — CBC WITH DIFFERENTIAL/PLATELET
Basophils Absolute: 0 10*3/uL (ref 0.0–0.2)
Basos: 0 %
EOS (ABSOLUTE): 0.1 10*3/uL (ref 0.0–0.4)
Eos: 2 %
Hematocrit: 40.1 % (ref 34.0–46.6)
Hemoglobin: 13.1 g/dL (ref 11.1–15.9)
Immature Grans (Abs): 0 10*3/uL (ref 0.0–0.1)
Immature Granulocytes: 0 %
Lymphocytes Absolute: 1.9 10*3/uL (ref 0.7–3.1)
Lymphs: 24 %
MCH: 31.5 pg (ref 26.6–33.0)
MCHC: 32.7 g/dL (ref 31.5–35.7)
MCV: 96 fL (ref 79–97)
Monocytes Absolute: 0.6 10*3/uL (ref 0.1–0.9)
Monocytes: 8 %
Neutrophils Absolute: 5 10*3/uL (ref 1.4–7.0)
Neutrophils: 66 %
Platelets: 283 10*3/uL (ref 150–450)
RBC: 4.16 x10E6/uL (ref 3.77–5.28)
RDW: 12.4 % (ref 11.7–15.4)
WBC: 7.7 10*3/uL (ref 3.4–10.8)

## 2022-10-16 LAB — IGG, IGA, IGM
IgA/Immunoglobulin A, Serum: 106 mg/dL (ref 87–352)
IgG (Immunoglobin G), Serum: 942 mg/dL (ref 586–1602)
IgM (Immunoglobulin M), Srm: 17 mg/dL — ABNORMAL LOW (ref 26–217)

## 2022-10-22 NOTE — Telephone Encounter (Signed)
Medicaid Berkley Harvey: [id number] exp. 10/15/2022-11/14/2022

## 2022-11-20 ENCOUNTER — Encounter: Payer: Self-pay | Admitting: Adult Health

## 2022-11-23 ENCOUNTER — Encounter: Payer: Self-pay | Admitting: Adult Health

## 2022-11-25 ENCOUNTER — Encounter: Payer: Self-pay | Admitting: Adult Health

## 2022-11-25 NOTE — Telephone Encounter (Signed)
Medicaid Berkley Harvey: [id number] exp. 11/24/2022-12/24/2022

## 2022-11-26 ENCOUNTER — Encounter: Payer: Self-pay | Admitting: Adult Health

## 2022-11-27 ENCOUNTER — Ambulatory Visit
Admission: RE | Admit: 2022-11-27 | Discharge: 2022-11-27 | Disposition: A | Discharge status: Home / Self Care | Payer: No Typology Code available for payment source | Source: Ambulatory Visit | Attending: Adult Health | Admitting: Adult Health

## 2022-11-27 DIAGNOSIS — Z79899 Other long term (current) drug therapy: Secondary | ICD-10-CM

## 2022-11-27 DIAGNOSIS — G35 Multiple sclerosis: Secondary | ICD-10-CM | POA: Diagnosis not present

## 2022-11-27 MED ORDER — GADOPICLENOL 0.5 MMOL/ML IV SOLN
10.0000 mL | Freq: Once | INTRAVENOUS | Status: AC | PRN
  Administered 2022-11-27: 10 mL via INTRAVENOUS

## 2022-11-30 ENCOUNTER — Ambulatory Visit
Admission: RE | Admit: 2022-11-30 | Discharge: 2022-11-30 | Disposition: A | Discharge status: Home / Self Care | Payer: No Typology Code available for payment source | Source: Ambulatory Visit | Attending: Adult Health | Admitting: Adult Health

## 2022-11-30 DIAGNOSIS — G35 Multiple sclerosis: Secondary | ICD-10-CM

## 2022-11-30 DIAGNOSIS — Z79899 Other long term (current) drug therapy: Secondary | ICD-10-CM

## 2022-11-30 DIAGNOSIS — G35D Multiple sclerosis, unspecified: Secondary | ICD-10-CM

## 2022-11-30 MED ORDER — GADOPICLENOL 0.5 MMOL/ML IV SOLN
10.0000 mL | Freq: Once | INTRAVENOUS | Status: AC | PRN
  Administered 2022-11-30: 10 mL via INTRAVENOUS

## 2023-03-30 ENCOUNTER — Ambulatory Visit (INDEPENDENT_AMBULATORY_CARE_PROVIDER_SITE_OTHER): Payer: No Typology Code available for payment source | Admitting: Neurology

## 2023-03-30 ENCOUNTER — Encounter: Payer: Self-pay | Admitting: Neurology

## 2023-03-30 VITALS — BP 128/85 | HR 67 | Ht 62.0 in | Wt 264.5 lb

## 2023-03-30 DIAGNOSIS — M542 Cervicalgia: Secondary | ICD-10-CM | POA: Diagnosis not present

## 2023-03-30 DIAGNOSIS — R269 Unspecified abnormalities of gait and mobility: Secondary | ICD-10-CM

## 2023-03-30 DIAGNOSIS — Z79899 Other long term (current) drug therapy: Secondary | ICD-10-CM | POA: Diagnosis not present

## 2023-03-30 DIAGNOSIS — G35 Multiple sclerosis: Secondary | ICD-10-CM

## 2023-03-30 DIAGNOSIS — H469 Unspecified optic neuritis: Secondary | ICD-10-CM

## 2023-03-30 DIAGNOSIS — R519 Headache, unspecified: Secondary | ICD-10-CM

## 2023-03-30 DIAGNOSIS — R200 Anesthesia of skin: Secondary | ICD-10-CM

## 2023-03-30 MED ORDER — GABAPENTIN 300 MG PO CAPS: 300.0000 mg | ORAL_CAPSULE | Freq: Three times a day (TID) | ORAL | 11 refills | Status: AC

## 2023-03-30 NOTE — Progress Notes (Addendum)
 [facility]  PATIENT: Hannah Morris DOB: 04-17-1985  REFERRING DOCTOR OR PCP: Rolan Quale, DO (referral); [facility] (PCP) SOURCE: Patient, notes from emergency room, lab results, imaging results, CT scan from 2020 personally reviewed.  _________________________________   HISTORICAL  CHIEF COMPLAINT:  Chief Complaint  Patient presents with   Room 10    Pt is here Alone. Pt states that her legs have been giving her pain, she can't sit too long, or move too long. Pt states that 1 week and 1/2 ago she had some double vision.     HISTORY OF PRESENT ILLNESS:  Hannah Morris is a 38 y.o. woman who is diagnosed with relapsing remitting MS after presenting with right optic neuritis early June 2023  Update 03/30/2023: She is tolerating Ocrevus well and has no exacerbations.    Next infusion will be April 2025    She has had a HA x 10 days.  Pain is in the right temple and behind the right eye. SABRA Splinter felt vision was blurry OD along with the HA but nowhere as bad as the ON episodes   No N/V but has photo/phonophobia.    Moving is not altering pain much.   Tylenol takes the edge off some but HA returns.      She had severe ON and was unable to read out of OD in 2023 but now she feels VA and color vision is baseline.  She notes gait is a little off due to foot pain.  She felt she completely recovered with gait and that it is a little worse but was much worse in 2023. She gets spasms in her legs that are painful.     She continues to experience some numbness in the legs, left greater than right.This can be uncomfortable.   The numbness in the groin has resolved.  She does not note any bowel or bladder difficulties.     She denies much fatigue.    She sleeps poorly but this is chronic,    She notes no difficulty with cognition or dperession.   She has had some anxiety with current health issues.        History of MS: She i began to experience right eye  pain/headace in June 2023.  Pain was worse with movements during the first few days.   Advil  helped slightly.   The pain was centered in and around the right eye.    Visual loss on the right started a day or two after the eye pain started.   Initially, visual acuity was reduced with a gray veil but over 2-3 days, vision was nearly completely lost.    About a week after the visual loss began, she had the onset of numbness initially in the legs and then in the groin.  Additionally, gait disturbance and gait ataxia was noted.  MS was suspected an MRI of the brain and spine was performed June 2023.  They showed multiple supratentorial and infratentorial lesions in the brain and multiple foci in the spinal cord.  At least one focus in the spinal cord and one focus in the brain enhanced after contrast.  She is otherwise in good health.   Vit D was low and she takes a supplement x 1 month.   She had gestational diabetes x 4 pregnancies.     No FH of MS.  Imaging: MRI brain, cervical spie and thoracic spine 11/27/2022 was unchanged compared to 2023.   MRI  of the brain 08/15/2021 showed  multiple T2/FLAIR hyperintense foci in the cerebral hemispheres in a few other foci in the cerebral hemispheres, pons and thalamus in a pattern consistent with chronic demyelinating plaque associated with multiple sclerosis.  1 small focus in the juxtacortical white matter of the left parietal lobe enhanced after contrast administration consistent with an acute demyelinating plaque  MRI of the cervical spine 08/15/2021 showed T2 hyperintense foci within the spinal cord towards the right adjacent to C2-C3, posteriory adjacent to C3, posterolaterally to the left adjacent to C4, anteriorly to the left adjacent to C5, and towards the right adjacent to C6-C7.   there appears to be very subtle enhancement of the focus adjacent to C3 and the focus adjacent to C6-C7.  The C6-C7 enhancement is also seen on axial images.  Additionally, she had  mild spinal stenosis at C3-C4 and C5-C6 but no nerve root compression.  MRI of the thoracic spine 08/18/2021 showed foci centrally adjacent to T1-T2, posterolaterally to the right adjacent to T3, posteriorly, slightly to the right adjacent to T8-T9, to the right adjacent to T9, posteriorly adjacent to T10 through T10-T11, posteriorly adjacent to T11-T12.  There is punctate enhancement involving the focus at T10-T11 after contrast was administered  CT scan of the head 01/25/2019 was normal.   REVIEW OF SYSTEMS: Constitutional: No fevers, chills, sweats, or change in appetite Eyes: As above. Ear, nose and throat: No hearing loss, ear pain, nasal congestion, sore throat Cardiovascular: No chest pain, palpitations Respiratory:  No shortness of breath at rest or with exertion.   No wheezes GastrointestinaI: No nausea, vomiting, diarrhea, abdominal pain, fecal incontinence Genitourinary:  No dysuria, urinary retention or frequency.  No nocturia. Musculoskeletal:  No neck pain, back pain Integumentary: No rash, pruritus, skin lesions Neurological: as above Psychiatric: No depression at this time.  No anxiety Endocrine: No palpitations, diaphoresis, change in appetite, change in weigh or increased thirst.  Gestational diabetes. Hematologic/Lymphatic:  No anemia, purpura, petechiae. Allergic/Immunologic: No itchy/runny eyes, nasal congestion, recent allergic reactions, rashes  ALLERGIES: No Known Allergies  HOME MEDICATIONS:  Current Outpatient Medications:    gabapentin  (NEURONTIN ) 300 MG capsule, Take 1 capsule (300 mg total) by mouth 3 (three) times daily., Disp: 90 capsule, Rfl: 11   ocrelizumab (OCREVUS) 300 MG/10ML injection, Inject 600 mg into the vein every 6 (six) months., Disp: , Rfl:    acetaminophen (TYLENOL) 325 MG tablet, Take 650 mg by mouth every 6 (six) hours as needed. (Patient not taking: Reported on 03/30/2023), Disp: , Rfl:    diazepam  (VALIUM ) 2 MG tablet, Take 1 tablet (2 mg  total) by mouth as needed for anxiety (prior to MRI). Take 30-60 min prior to MRI, can repeat x1 if needed (Patient not taking: Reported on 03/30/2023), Disp: 2 tablet, Rfl: 0  PAST MEDICAL HISTORY: Past Medical History:  Diagnosis Date   Asthma    Gestational diabetes     PAST SURGICAL HISTORY: Past Surgical History:  Procedure Laterality Date   TONSILLECTOMY AND ADENOIDECTOMY      FAMILY HISTORY: Family History  Problem Relation Age of Onset   Hypertension Mother    Hypertension Father     SOCIAL HISTORY:  Social History   Socioeconomic History   Marital status: Married    Spouse name: Not on file   Number of children: Not on file   Years of education: Not on file   Highest education level: Not on file  Occupational History   Not on file  Tobacco Use   Smoking status: Every Day    Current packs/day: 0.25    Average packs/day: 0.3 packs/day for 15.0 years (3.8 ttl pk-yrs)    Types: Cigarettes   Smokeless tobacco: Never  Vaping Use   Vaping status: Never Used  Substance and Sexual Activity   Alcohol use: Not Currently    Comment: occ   Drug use: No   Sexual activity: Not on file  Other Topics Concern   Not on file  Social History Narrative   Not on file   Social Drivers of Health   Financial Resource Strain: Not on file  Food Insecurity: Not on file  Transportation Needs: Not on file  Physical Activity: Not on file  Stress: Not on file  Social Connections: Not on file  Intimate Partner Violence: Not on file     PHYSICAL EXAM  Vitals:   03/30/23 1550  BP: 128/85  Pulse: 67  Weight: 264 lb 8 oz (120 kg)  Height: 5' 2 (1.575 m)     Body mass index is 48.38 kg/m.  No results found.  General: The patient is well-developed and well-nourished and in no acute distress  HEENT:  Head is [place]/AT.  Sclera are anicteric.  Neck: She has occipital tenderness on the right.   ROM is ok   Skin: Extremities are without rash or   edema.  Musculoskeletal:  Back is nontender  Neurologic Exam  Mental status: The patient is alert and oriented x 3 at the time of the examination. The patient has apparent normal recent and remote memory, with an apparently normal attention span and concentration ability.   Speech is normal.  Cranial nerves: Extraocular movements are full. Pupils show 2+ right APD.   Reduced acuity OD (can read now though) and mildly reduced color vision OD.   Facial symmetry is present. There is good facial sensation to soft touch bilaterally.Facial strength is normal.  Trapezius and sternocleidomastoid strength is normal. No dysarthria is noted.    No obvious hearing deficits are noted.  Motor:  Muscle bulk is normal.   Tone is mildly increased in legs.. Strength is  5 / 5 in all 4 extremities.   Sensory: Sensory testing is intact to pinprick, soft touch and vibration sensation in the arms and she reports reduced vibration in the left leg relative to the right  Coordination: Cerebellar testing reveals good finger-nose-finger and heel-to-shin bilaterally.  Gait and station: Station is normal.   Gait has mild right limp and is mildly wide.  Tandem gait is wide.  Romberg is negative.  Reflexes: Deep tendon reflexes are symmetric and normal in arms and increased at the knees and normal at ankles..    .      DIAGNOSTIC DATA (LABS, IMAGING, TESTING) - I reviewed patient records, labs, notes, testing and imaging myself where available.  Lab Results  Component Value Date   WBC 7.0 03/30/2023   HGB 12.9 03/30/2023   HCT 38.5 03/30/2023   MCV 95 03/30/2023   PLT 298 03/30/2023      Component Value Date/Time   NA 138 11/07/2020 2000   K 3.9 11/07/2020 2000   CL 106 11/07/2020 2000   CO2 26 11/07/2020 2000   GLUCOSE 72 11/07/2020 2000   BUN 9 11/07/2020 2000   CREATININE 0.90 11/07/2020 2000   CALCIUM 8.8 (L) 11/07/2020 2000   PROT 8.0 11/07/2020 2000   ALBUMIN 4.4 11/07/2020 2000   AST 15  11/07/2020 2000   ALT  16 11/07/2020 2000   ALKPHOS 52 11/07/2020 2000   BILITOT 0.4 11/07/2020 2000   GFRNONAA >60 11/07/2020 2000   GFRAA >60 02/26/2018 2118       ASSESSMENT AND PLAN  Multiple sclerosis (HCC) - Plan: IgG, IgA, IgM, CBC with Differential/Platelet  High risk medication use - Plan: IgG, IgA, IgM, CBC with Differential/Platelet  Right optic neuritis  Gait disturbance  Numbness  Neck pain  Occipital headache   Continue Ocrevus.  Possible small exacerbation at onset of treatment but not significant enough for steroid infusion.     Check labs next visit.   Check MRI around time of next visit and consider a change in therapy if there is subclinical progression..   stay active and exercise as tolerated.  Continue vitamin D supplements. Trigger point injection of the right splenius capitis muscle with 80 mg Depo-Medrol and 3 cc Marcaine using sterile technique.  She tolerated the injection well and there were no complications.  Pain was better afterwards.  If n no sustained benefit, consider adding nortrptyline Will add gabapentin  due to dysesthesia.  Also has muscle spasms from MS.  If not better may need a short medical leave and/or accomodation to be allowed to break 5 minutes every hour and/or a sit/stand adjustable desk. Return in 6 months or sooner for new or worsening neurologic symptoms.    Bahar Shelden A. Vear, MD, [facility] 03/31/2023, 7:19 AM Certified in Neurology, Clinical Neurophysiology, Sleep Medicine and Neuroimaging  [facility] [address], [address] [place], [place] [address] [phone]

## 2023-03-31 ENCOUNTER — Encounter: Payer: Self-pay | Admitting: Neurology

## 2023-03-31 LAB — CBC WITH DIFFERENTIAL/PLATELET
Basophils Absolute: 0 10*3/uL (ref 0.0–0.2)
Basos: 0 %
EOS (ABSOLUTE): 0.1 10*3/uL (ref 0.0–0.4)
Eos: 1 %
Hematocrit: 38.5 % (ref 34.0–46.6)
Hemoglobin: 12.9 g/dL (ref 11.1–15.9)
Immature Grans (Abs): 0 10*3/uL (ref 0.0–0.1)
Immature Granulocytes: 0 %
Lymphocytes Absolute: 1.4 10*3/uL (ref 0.7–3.1)
Lymphs: 20 %
MCH: 31.8 pg (ref 26.6–33.0)
MCHC: 33.5 g/dL (ref 31.5–35.7)
MCV: 95 fL (ref 79–97)
Monocytes Absolute: 0.8 10*3/uL (ref 0.1–0.9)
Monocytes: 11 %
Neutrophils Absolute: 4.7 10*3/uL (ref 1.4–7.0)
Neutrophils: 68 %
Platelets: 298 10*3/uL (ref 150–450)
RBC: 4.06 x10E6/uL (ref 3.77–5.28)
RDW: 11.8 % (ref 11.7–15.4)
WBC: 7 10*3/uL (ref 3.4–10.8)

## 2023-03-31 LAB — IGG, IGA, IGM
IgA/Immunoglobulin A, Serum: 122 mg/dL (ref 87–352)
IgG (Immunoglobin G), Serum: 979 mg/dL (ref 586–1602)
IgM (Immunoglobulin M), Srm: 17 mg/dL — ABNORMAL LOW (ref 26–217)

## 2023-04-20 ENCOUNTER — Other Ambulatory Visit: Payer: Self-pay | Admitting: Neurology

## 2023-04-20 ENCOUNTER — Encounter: Payer: Self-pay | Admitting: Neurology

## 2023-04-20 MED ORDER — LAMOTRIGINE 25 MG PO TABS: 25.0000 mg | ORAL_TABLET | Freq: Every day | ORAL | 5 refills | Status: AC

## 2023-04-22 ENCOUNTER — Ambulatory Visit: Payer: No Typology Code available for payment source | Admitting: Neurology

## 2023-05-27 ENCOUNTER — Telehealth: Payer: Self-pay | Admitting: *Deleted

## 2023-05-27 ENCOUNTER — Encounter: Payer: Self-pay | Admitting: Neurology

## 2023-05-27 ENCOUNTER — Telehealth (INDEPENDENT_AMBULATORY_CARE_PROVIDER_SITE_OTHER): Admitting: Neurology

## 2023-05-27 DIAGNOSIS — R269 Unspecified abnormalities of gait and mobility: Secondary | ICD-10-CM | POA: Diagnosis not present

## 2023-05-27 DIAGNOSIS — Z79899 Other long term (current) drug therapy: Secondary | ICD-10-CM

## 2023-05-27 DIAGNOSIS — M542 Cervicalgia: Secondary | ICD-10-CM | POA: Diagnosis not present

## 2023-05-27 DIAGNOSIS — G35 Multiple sclerosis: Secondary | ICD-10-CM

## 2023-05-27 DIAGNOSIS — R519 Headache, unspecified: Secondary | ICD-10-CM

## 2023-05-27 DIAGNOSIS — H469 Unspecified optic neuritis: Secondary | ICD-10-CM

## 2023-05-27 NOTE — Progress Notes (Signed)
 [facility]  PATIENT: Hannah Morris DOB: 06-Feb-1986  REFERRING DOCTOR OR PCP: Melene Plan, DO (referral); [facility] (PCP) SOURCE: Patient, notes from emergency room, lab results, imaging results, CT scan from 2020 personally reviewed.  _________________________________   HISTORICAL  Virtual Visit via Video Note I connected with Hannah Morris  on 05/27/23 at  3:00 PM EDT by a video enabled telemedicine application and verified that I am speaking with the correct person.  I discussed the limitations of evaluation and management by telemedicine and the availability of in person appointments. The patient expressed understanding and agreed to proceed.  Patient in car; provider in office    CHIEF COMPLAINT:  Chief Complaint  Patient presents with   Multiple Sclerosis   Medication Problem    HISTORY OF PRESENT ILLNESS:  Hannah Morris is a 38 y.o. woman who is diagnosed with relapsing remitting MS after presenting with right optic neuritis early June 2023  Update 03/30/2023: She had trouble tolerating Ocrevus last 2 infusion.   During infusion yesterday, she had itching but no rash/hives and no difficulty breathing.   She had a milder reaction similar to this one 6 months ago, as well.    She had severe HA in February and a splenius capitus TPI helped a lot.  .  Pain was is in the right temple and behind the right eye. .  No N/V but has photo/phonophobia.    The occipital nerve block/splenius capitus TPI last visit helped her for 6-8 weeks but pain is returning.   She would like to modify FMLA for migraines 1-2 days a month as needed.       She has no exacerbations.  She notes gait is a little off due to foot pain.  She felt she completely recovered with gait and that it is a little worse but was much worse in 2023. She gets spasms in her legs that are painful.     She continues to experience some numbness in the legs, left greater than right.This can be  uncomfortable.   The numbness in the groin has resolved.  She does not note any bowel or bladder difficulties.   She had severe ON and was unable to read out of OD in 2023 but now she feels VA and color vision is baseline.  She denies much fatigue.    She sleeps poorly but this is chronic,    She notes no difficulty with cognition or dperession.   She has had some anxiety with current health issues.       History of MS: She i began to experience right eye pain/headace in June 2023.  Pain was worse with movements during the first few days.   Advil helped slightly.   The pain was centered in and around the right eye.    Visual loss on the right started a day or two after the eye pain started.   Initially, visual acuity was reduced with a gray veil but over 2-3 days, vision was nearly completely lost.    About a week after the visual loss began, she had the onset of numbness initially in the legs and then in the groin.  Additionally, gait disturbance and gait ataxia was noted.  MS was suspected an MRI of the brain and spine was performed June 2023.  They showed multiple supratentorial and infratentorial lesions in the brain and multiple foci in the spinal cord.  At least one focus in the spinal cord and one  focus in the brain enhanced after contrast.  She is otherwise in good health.   Vit D was low and she takes a supplement x 1 month.   She had gestational diabetes x 4 pregnancies.     No FH of MS.  Imaging: MRI brain, cervical spie and thoracic spine 11/27/2022 was unchanged compared to 2023.   MRI of the brain 08/15/2021 showed  multiple T2/FLAIR hyperintense foci in the cerebral hemispheres in a few other foci in the cerebral hemispheres, pons and thalamus in a pattern consistent with chronic demyelinating plaque associated with multiple sclerosis.  1 small focus in the juxtacortical white matter of the left parietal lobe enhanced after contrast administration consistent with an acute demyelinating  plaque  MRI of the cervical spine 08/15/2021 showed T2 hyperintense foci within the spinal cord towards the right adjacent to C2-C3, posteriory adjacent to C3, posterolaterally to the left adjacent to C4, anteriorly to the left adjacent to C5, and towards the right adjacent to C6-C7.   there appears to be very subtle enhancement of the focus adjacent to C3 and the focus adjacent to C6-C7.  The C6-C7 enhancement is also seen on axial images.  Additionally, she had mild spinal stenosis at C3-C4 and C5-C6 but no nerve root compression.  MRI of the thoracic spine 08/18/2021 showed foci centrally adjacent to T1-T2, posterolaterally to the right adjacent to T3, posteriorly, slightly to the right adjacent to T8-T9, to the right adjacent to T9, posteriorly adjacent to T10 through T10-T11, posteriorly adjacent to T11-T12.  There is punctate enhancement involving the focus at T10-T11 after contrast was administered  CT scan of the head 01/25/2019 was normal.   REVIEW OF SYSTEMS: Constitutional: No fevers, chills, sweats, or change in appetite Eyes: As above. Ear, nose and throat: No hearing loss, ear pain, nasal congestion, sore throat Cardiovascular: No chest pain, palpitations Respiratory:  No shortness of breath at rest or with exertion.   No wheezes GastrointestinaI: No nausea, vomiting, diarrhea, abdominal pain, fecal incontinence Genitourinary:  No dysuria, urinary retention or frequency.  No nocturia. Musculoskeletal:  No neck pain, back pain Integumentary: No rash, pruritus, skin lesions Neurological: as above Psychiatric: No depression at this time.  No anxiety Endocrine: No palpitations, diaphoresis, change in appetite, change in weigh or increased thirst.  Gestational diabetes. Hematologic/Lymphatic:  No anemia, purpura, petechiae. Allergic/Immunologic: No itchy/runny eyes, nasal congestion, recent allergic reactions, rashes  ALLERGIES: No Known Allergies  HOME MEDICATIONS:  Current  Outpatient Medications:    acetaminophen (TYLENOL) 325 MG tablet, Take 650 mg by mouth every 6 (six) hours as needed. (Patient not taking: Reported on 03/30/2023), Disp: , Rfl:    diazepam (VALIUM) 2 MG tablet, Take 1 tablet (2 mg total) by mouth as needed for anxiety (prior to MRI). Take 30-60 min prior to MRI, can repeat x1 if needed (Patient not taking: Reported on 03/30/2023), Disp: 2 tablet, Rfl: 0   gabapentin (NEURONTIN) 300 MG capsule, Take 1 capsule (300 mg total) by mouth 3 (three) times daily., Disp: 90 capsule, Rfl: 11   lamoTRIgine (LAMICTAL) 25 MG tablet, Take 1 tablet (25 mg total) by mouth daily. One po every day x 5 days, then one po bid x 5 days, then one po tid x 5 days, then 2 po bid, Disp: 120 tablet, Rfl: 5   ocrelizumab (OCREVUS) 300 MG/10ML injection, Inject 600 mg into the vein every 6 (six) months., Disp: , Rfl:   PAST MEDICAL HISTORY: Past Medical History:  Diagnosis Date  Asthma    Gestational diabetes     PAST SURGICAL HISTORY: Past Surgical History:  Procedure Laterality Date   TONSILLECTOMY AND ADENOIDECTOMY      FAMILY HISTORY: Family History  Problem Relation Age of Onset   Hypertension Mother    Hypertension Father     SOCIAL HISTORY:  Social History   Socioeconomic History   Marital status: Married    Spouse name: Not on file   Number of children: Not on file   Years of education: Not on file   Highest education level: Not on file  Occupational History   Not on file  Tobacco Use   Smoking status: Every Day    Current packs/day: 0.25    Average packs/day: 0.3 packs/day for 15.0 years (3.8 ttl pk-yrs)    Types: Cigarettes   Smokeless tobacco: Never  Vaping Use   Vaping status: Never Used  Substance and Sexual Activity   Alcohol use: Not Currently    Comment: occ   Drug use: No   Sexual activity: Not on file  Other Topics Concern   Not on file  Social History Narrative   Not on file   Social Drivers of Health   Financial Resource  Strain: Not on file  Food Insecurity: Not on file  Transportation Needs: Not on file  Physical Activity: Not on file  Stress: Not on file  Social Connections: Not on file  Intimate Partner Violence: Not on file     PHYSICAL EXAM  There were no vitals filed for this visit.    There is no height or weight on file to calculate BMI.  No results found.  General: The patient is well-developed and well-nourished and in no acute distress  HEENT:  Head is [place]/AT.  Sclera are anicteric.  Neck: She has occipital tenderness on the right.   ROM is ok   Skin: Extremities are without rash or  edema.  Musculoskeletal:  Back is nontender  Neurologic Exam  Mental status: The patient is alert and oriented x 3 at the time of the examination. The patient has apparent normal recent and remote memory, with an apparently normal attention span and concentration ability.   Speech is normal.  Cranial nerves: Extraocular movements are full. Pupils show 2+ right APD.   Reduced acuity OD (is able to read) and mildly reduced color vision OD.   Facial symmetry is present. There is good facial sensation to soft touch bilaterally.Facial strength is normal.  Trapezius and sternocleidomastoid strength is normal. No dysarthria is noted.    No obvious hearing deficits are noted.  Motor:  Muscle bulk is normal.   Tone is mildly increased in legs.. Strength is  5 / 5 in all 4 extremities.   Sensory: Sensory testing is intact to pinprick, soft touch and vibration sensation in the arms and she reports reduced vibration in the left leg relative to the right  Coordination: Cerebellar testing reveals good finger-nose-finger and heel-to-shin bilaterally.  Gait and station: Station is normal.   Gait has mild right limp and is mildly wide.  Tandem gait is wide.  Romberg is negative.  Reflexes: Deep tendon reflexes are symmetric and normal in arms and increased at the knees and normal at ankles..     .      DIAGNOSTIC DATA (LABS, IMAGING, TESTING) - I reviewed patient records, labs, notes, testing and imaging myself where available.  Lab Results  Component Value Date   WBC 7.0 03/30/2023   HGB  12.9 03/30/2023   HCT 38.5 03/30/2023   MCV 95 03/30/2023   PLT 298 03/30/2023      Component Value Date/Time   NA 138 11/07/2020 2000   K 3.9 11/07/2020 2000   CL 106 11/07/2020 2000   CO2 26 11/07/2020 2000   GLUCOSE 72 11/07/2020 2000   BUN 9 11/07/2020 2000   CREATININE 0.90 11/07/2020 2000   CALCIUM 8.8 (L) 11/07/2020 2000   PROT 8.0 11/07/2020 2000   ALBUMIN 4.4 11/07/2020 2000   AST 15 11/07/2020 2000   ALT 16 11/07/2020 2000   ALKPHOS 52 11/07/2020 2000   BILITOT 0.4 11/07/2020 2000   GFRNONAA >60 11/07/2020 2000   GFRAA >60 02/26/2018 2118       ASSESSMENT AND PLAN  Multiple sclerosis (HCC)  High risk medication use  Right optic neuritis  Gait disturbance  Neck pain  Occipital headache   Due to worsening infusion reactions, we will switch Ocrevus to Briumvi.  Labs have been fine.    Check MRI later in 2025  and consider a change in therapy if there is subclinical progression..   stay active and exercise as tolerated.  Continue vitamin D supplements. She has had some occipital HA radiating to the forehead.   TPI of the right splenius capitis muscle helped tremendously x 2 months but pain is coming back.  We can modify FMLA to allow 1-2 days /month if HA flares.    If HA worsens further, consider adding nortrptyline Due to dysesthesias, stiffness, may need  accomodation to be allowed to break 5 minutes every hour and/or a sit/stand adjustable desk. Return in 6 months or sooner for new or worsening neurologic symptoms.    Follow Up Instructions: I discussed the assessment and treatment plan with the patient. The patient was provided an opportunity to ask questions and all were answered. The patient agreed with the plan and demonstrated an understanding of  the instructions.    The patient was advised to call back or seek an in-person evaluation if the symptoms worsen or if the condition fails to improve as anticipated.  I provided 25 minutes of non-face-to-face time during this encounter.  Demetrion Wesby A. Epimenio Foot, MD, [facility] 05/27/2023, 3:41 PM Certified in Neurology, Clinical Neurophysiology, Sleep Medicine and Neuroimaging  [facility] [address] [place], [place] [address] [phone]

## 2023-05-27 NOTE — Telephone Encounter (Signed)
 Returned phone call, would like a call back

## 2023-05-27 NOTE — Telephone Encounter (Signed)
 Per Kim/[facility]: "We had Hannah Morris 11/06/1985 in for Ocrevus yesterday and she had a reaction that took 2 additional doses of Benadryl to control. I told Sater about it and asked if we could switch her to [facility] for her next infusion (11/24/23) and he said yes so we will do a new order for her in Sept. to get her auth'd in time for her appt.Marland KitchenMarland KitchenMarland KitchenMarland Kitchenjust wanted to keep you in the loop"   I spoke w/ Dr. Epimenio Foot who confirmed she will need visit to make change. We can work her in at [facility] today, appt on hold (can be in person or mychart VV). I called and LVM for pt to call office.

## 2023-05-27 NOTE — Telephone Encounter (Signed)
 Called and spoke w/ pt. She agreed to mychart VV at 3pm today with Dr. Epimenio Foot. I scheduled. Explained how she completes. She will call if she has trouble logging on.

## 2023-06-09 ENCOUNTER — Encounter: Payer: Self-pay | Admitting: Neurology

## 2023-06-23 ENCOUNTER — Encounter: Payer: Self-pay | Admitting: *Deleted

## 2023-07-06 ENCOUNTER — Telehealth: Payer: Self-pay | Admitting: Neurology

## 2023-07-06 ENCOUNTER — Encounter: Payer: Self-pay | Admitting: *Deleted

## 2023-07-06 NOTE — Telephone Encounter (Signed)
 Hannah Morris([employer] ) called in regards to Disability form that was sent out last month.and if we received Form. Informed Pt has to give us  a call in order to get paperwork faxed .  Callback# [phone]

## 2023-07-06 NOTE — Telephone Encounter (Signed)
Debra, can you help with this? Thank you 

## 2023-08-16 ENCOUNTER — Other Ambulatory Visit: Payer: Self-pay | Admitting: *Deleted

## 2023-08-16 MED ORDER — BACLOFEN 10 MG PO TABS: 10.0000 mg | ORAL_TABLET | Freq: Three times a day (TID) | ORAL | 5 refills | Status: DC

## 2023-08-19 NOTE — Telephone Encounter (Signed)
 Gave completed/signed form back to medical records to process for pt.

## 2023-09-08 ENCOUNTER — Telehealth: Payer: Self-pay | Admitting: Neurology

## 2023-09-08 NOTE — Telephone Encounter (Signed)
 [employer]  called to confirm if a fax was received, call was connected to Medical records

## 2023-09-15 NOTE — Telephone Encounter (Signed)
 Received [employer] fax requesting additional info, looks like attending physicians statement was not originally filled out, bringing to POD 1 for review

## 2023-09-21 ENCOUNTER — Telehealth: Payer: Self-pay | Admitting: *Deleted

## 2023-09-21 NOTE — Telephone Encounter (Signed)
 I faxed pt last office note to [employer] on 09/21/2023

## 2023-09-22 ENCOUNTER — Encounter: Payer: Self-pay | Admitting: *Deleted

## 2023-10-05 NOTE — Telephone Encounter (Signed)
 Per Dr. Vear- he wants me to call pt to see if she wants to change from Ocrevus to [facility] d/t not tolerating Ocrevus well.   Called pt at [phone]. Received VM that her phone is broken currently and to call [phone]. I called this phone number.  She was agreeable to change to [facility] and will try to stop by office today to sign start form. She will also bring updated insurance cards to be scanned in since we do not have any on file.   She wanted to know if Dr. Vear would consider changing STD form for her to have continuous leave for 1 month. Reports increased muscle spasms. Taking baclofen  but expressed concern about long term use. I did explain to pt this helps manage muscle spasms in MS pt and helps her better function day to day. She is experiencing more muscle cramps in hands from using computer mouse daily at work. Having continued headaches. States work will not allow part-time. Only full time work. Aware I will discuss with MD and call her back about this.

## 2023-10-05 NOTE — Telephone Encounter (Signed)
 Gave completed/signed form back to MR to process for pt.

## 2023-10-06 ENCOUNTER — Telehealth: Payer: Self-pay | Admitting: *Deleted

## 2023-10-06 NOTE — Telephone Encounter (Signed)
 Pt [employer] form faxed on 10/05/2023

## 2023-10-19 ENCOUNTER — Encounter: Payer: Self-pay | Admitting: Neurology

## 2023-10-19 NOTE — Telephone Encounter (Signed)
 Gave addended forms back to medical records to process for pt.

## 2023-10-28 DIAGNOSIS — Z0289 Encounter for other administrative examinations: Secondary | ICD-10-CM

## 2023-11-01 ENCOUNTER — Encounter: Payer: Self-pay | Admitting: *Deleted

## 2023-11-03 ENCOUNTER — Telehealth: Payer: Self-pay | Admitting: *Deleted

## 2023-11-03 NOTE — Telephone Encounter (Signed)
 I faxed pt FMLA form and [employer] form on 11/03/2023

## 2023-11-03 NOTE — Telephone Encounter (Signed)
Gave completed/signed forms to medical records to process for pt. ?

## 2023-11-04 ENCOUNTER — Telehealth: Payer: Self-pay | Admitting: *Deleted

## 2023-11-04 NOTE — Telephone Encounter (Signed)
 Faxed completed/signed [employer] start form to [employer] at [phone]. Received fax confirmation.  Gave signed orders to [facility] to start processing

## 2023-11-11 NOTE — Telephone Encounter (Signed)
 Sent message to [facility] to get update on where things are with getting pt transitioned to [facility], waiting on response

## 2023-11-16 NOTE — Telephone Encounter (Signed)
 Gave updated forms to medical records to process for pt.

## 2023-11-22 NOTE — Telephone Encounter (Signed)
 Per [place] in [place], patient is in final review.

## 2023-11-30 NOTE — Telephone Encounter (Signed)
 Gave updated form back to medical records to process for pt.

## 2023-12-01 ENCOUNTER — Encounter: Payer: Self-pay | Admitting: Neurology

## 2023-12-01 NOTE — Telephone Encounter (Signed)
 Per [place] in [place], patient is scheduled for 12/21/2023.

## 2023-12-01 NOTE — Telephone Encounter (Signed)
 Per [facility] in [place], patient is scheduled for 10/14 & 10/28.

## 2023-12-07 ENCOUNTER — Encounter: Payer: Self-pay | Admitting: Neurology

## 2023-12-09 ENCOUNTER — Telehealth: Payer: Self-pay | Admitting: *Deleted

## 2023-12-09 NOTE — Telephone Encounter (Signed)
 Pt last note from 12/07/2023 faxed to [employer] om 12/09/2023

## 2024-01-06 ENCOUNTER — Telehealth: Payer: Self-pay | Admitting: Neurology

## 2024-01-06 NOTE — Telephone Encounter (Signed)
 [employer] [facility]) calling in regards to copay assistance for [employer].  Would like a call back.

## 2024-01-06 NOTE — Telephone Encounter (Signed)
 Asked Holly/[facility] to call since pt infused with [facility]. Provided phone#[phone]

## 2024-01-11 NOTE — Telephone Encounter (Signed)
 Sent message to [place] with [facility].

## 2024-01-11 NOTE — Telephone Encounter (Signed)
 [employer] (Josh)Trying to confirm if patient is enrollment in patient assistance program for [employer],. Can contact at [phone]

## 2024-01-11 NOTE — Telephone Encounter (Signed)
 Asked [facility] to ask them to call [employer] again since they have called again.

## 2024-02-18 ENCOUNTER — Emergency Department (HOSPITAL_BASED_OUTPATIENT_CLINIC_OR_DEPARTMENT_OTHER)

## 2024-02-18 ENCOUNTER — Emergency Department (HOSPITAL_BASED_OUTPATIENT_CLINIC_OR_DEPARTMENT_OTHER)
Admission: EM | Admit: 2024-02-18 | Discharge: 2024-02-19 | Disposition: A | Discharge status: Home / Self Care | Attending: Emergency Medicine | Admitting: Emergency Medicine

## 2024-02-18 ENCOUNTER — Other Ambulatory Visit: Payer: Self-pay

## 2024-02-18 ENCOUNTER — Encounter (HOSPITAL_BASED_OUTPATIENT_CLINIC_OR_DEPARTMENT_OTHER): Payer: Self-pay

## 2024-02-18 DIAGNOSIS — R059 Cough, unspecified: Secondary | ICD-10-CM | POA: Diagnosis present

## 2024-02-18 DIAGNOSIS — J069 Acute upper respiratory infection, unspecified: Secondary | ICD-10-CM

## 2024-02-18 DIAGNOSIS — J111 Influenza due to unidentified influenza virus with other respiratory manifestations: Secondary | ICD-10-CM | POA: Insufficient documentation

## 2024-02-18 LAB — RESP PANEL BY RT-PCR (RSV, FLU A&B, COVID)  RVPGX2
Influenza A by PCR: NEGATIVE
Influenza B by PCR: NEGATIVE
Resp Syncytial Virus by PCR: NEGATIVE
SARS Coronavirus 2 by RT PCR: NEGATIVE

## 2024-02-18 MED ORDER — KETOROLAC TROMETHAMINE 15 MG/ML IJ SOLN
30.0000 mg | Freq: Once | INTRAMUSCULAR | Status: AC
  Administered 2024-02-19: 30 mg via INTRAMUSCULAR
  Filled 2024-02-18: qty 2

## 2024-02-18 MED ORDER — CELECOXIB 200 MG PO CAPS: 200.0000 mg | ORAL_CAPSULE | Freq: Two times a day (BID) | ORAL | 0 refills | Status: AC

## 2024-02-18 MED ORDER — DEXAMETHASONE 4 MG PO TABS
10.0000 mg | ORAL_TABLET | Freq: Once | ORAL | Status: AC
  Administered 2024-02-19: 10 mg via ORAL
  Filled 2024-02-18: qty 3

## 2024-02-18 MED ORDER — ACETAMINOPHEN 500 MG PO TABS
1000.0000 mg | ORAL_TABLET | Freq: Once | ORAL | Status: AC
  Administered 2024-02-19: 1000 mg via ORAL
  Filled 2024-02-18: qty 2

## 2024-02-18 NOTE — ED Provider Notes (Signed)
 " [place] EMERGENCY DEPARTMENT AT [facility] [place] Provider Note   CSN: [id number] Arrival date & time: 02/18/24  1805     History No chief complaint on file.   HPI Hannah Morris is a 39 y.o. female presenting for chief complaint of diffuse body pain. States diffuse body aches, cough, sore throat and nausea. HX of MS, not on medications.  Patient's recorded medical, surgical, social, medication list and allergies were reviewed in the Snapshot window as part of the initial history.   Review of Systems   Review of Systems  Constitutional:  Negative for chills and fever.  HENT:  Positive for congestion and sore throat. Negative for ear pain.   Eyes:  Negative for pain and visual disturbance.  Respiratory:  Positive for cough. Negative for choking and shortness of breath.   Cardiovascular:  Negative for chest pain and palpitations.  Gastrointestinal:  Negative for abdominal pain and vomiting.  Genitourinary:  Negative for dysuria and hematuria.  Musculoskeletal:  Negative for arthralgias and back pain.  Skin:  Negative for color change and rash.  Neurological:  Negative for seizures and syncope.  All other systems reviewed and are negative.   Physical Exam Updated Vital Signs BP 120/80   Pulse 72   Temp 99.7 F (37.6 C)   Ht 5' 2 (1.575 m)   Wt 121.6 kg   LMP 02/16/2024   SpO2 95%   BMI 49.02 kg/m  Physical Exam Constitutional:      General: She is not in acute distress.    Appearance: She is not ill-appearing or toxic-appearing.  HENT:     Head: Normocephalic and atraumatic.     Nose: Congestion and rhinorrhea present.     Mouth/Throat:     Pharynx: Posterior oropharyngeal erythema present.  Eyes:     Extraocular Movements: Extraocular movements intact.     Pupils: Pupils are equal, round, and reactive to light.  Cardiovascular:     Rate and Rhythm: Normal rate.  Pulmonary:     Effort: No respiratory distress.  Abdominal:     General: Abdomen is  flat.  Musculoskeletal:        General: No swelling, deformity or signs of injury.     Cervical back: Normal range of motion. No rigidity.  Skin:    General: Skin is warm and dry.  Neurological:     General: No focal deficit present.     Mental Status: She is alert and oriented to person, place, and time.  Psychiatric:        Mood and Affect: Mood normal.      ED Course/ Medical Decision Making/ A&P    Procedures Procedures   Medications Ordered in ED Medications  acetaminophen  (TYLENOL ) tablet 1,000 mg (has no administration in time range)  ketorolac  (TORADOL ) 15 MG/ML injection 30 mg (has no administration in time range)  dexamethasone  (DECADRON ) tablet 10 mg (has no administration in time range)   Medical Decision Making:   Hannah Morris is a 38 y.o. female who presented to the ED today with subjective fever, cough, congestion detailed above.    Patient placed on continuous vitals and telemetry monitoring while in ED which was reviewed periodically.   Complete initial physical exam performed, notably the patient  was hemodynamically stable in no acute distress.  Posterior oropharynx illuminated and without obvious swelling or deformity.  Patient is without neck stiffness.    Reviewed and confirmed nursing documentation for past medical history, family history, social history.  Initial Assessment:   With the patient's presentation of fever cough congestion, most likely diagnosis is developing viral upper respiratory infection. Other diagnoses were considered including (but not limited to) peritonsillar abscess, retropharyngeal abscess, pneumonia. These are considered less likely due to history of present illness and physical exam findings.   This is most consistent with an acute life/limb threatening illness complicated by underlying chronic conditions. Considered meningitis, however patient's symptoms, vital signs, physical exam findings including lack of meningismus seem  grossly less consistent at this time. Initial Plan:  Viral screening including COVID/flu testing to evaluate for common viral etiologies that need to be tracked CXR to evaluate for structural/infectious intrathoracic pathology.  Empiric treatment with antipyretics including acetaminophen  in ambulatory setting Additionally will treat sore throat with dexamethasone  dose Objective evaluation as below reviewed   Initial Study Results:   Laboratory  All laboratory results reviewed without evidence of clinically relevant pathology.    Radiology:  All images reviewed independently. Agree with radiology report at this time.   DG Chest 2 View  Final Result         Final Assessment and Plan:   On reassessment, patient is ambulatory tolerating p.o. intake in no acute distress.   Patient's COVID test is negative.  Patient is currently stable for outpatient care and management with no indication for hospitalization or transfer at this time.  Discussed all findings with patient expressed understanding.  Disposition:  Based on the above findings, I believe patient is stable for discharge.    Patient/family educated about specific return precautions for given chief complaint and symptoms.  Patient/family educated about follow-up with PCP.     Patient/family expressed understanding of return precautions and need for follow-up. Patient spoken to regarding all imaging and laboratory results and appropriate follow up for these results. All education provided in verbal form with additional information in written form. Time was allowed for answering of patient questions. Patient discharged.    Emergency Department Medication Summary:   Medications  acetaminophen  (TYLENOL ) tablet 1,000 mg (has no administration in time range)  ketorolac  (TORADOL ) 15 MG/ML injection 30 mg (has no administration in time range)  dexamethasone  (DECADRON ) tablet 10 mg (has no administration in time range)        Clinical Impression:  1. Upper respiratory tract infection, unspecified type   2. Flu      Discharge   Final Clinical Impression(s) / ED Diagnoses Final diagnoses:  Flu  Upper respiratory tract infection, unspecified type    Rx / DC Orders ED Discharge Orders          Ordered    celecoxib  (CELEBREX ) 200 MG capsule  2 times daily        02/18/24 2358              Jerral Meth, MD 02/18/24 2359  "

## 2024-02-18 NOTE — ED Triage Notes (Signed)
 Patient reports last night her legs were hurting, and today when she woke up her entire body has started hurting. She has a productive cough with phlegm, chills and fever and no appetite.  Patient denies sick contacts.

## 2024-02-19 DIAGNOSIS — J111 Influenza due to unidentified influenza virus with other respiratory manifestations: Secondary | ICD-10-CM | POA: Diagnosis not present

## 2024-03-10 ENCOUNTER — Other Ambulatory Visit: Payer: Self-pay | Admitting: Neurology
